# Patient Record
Sex: Male | Born: 1985 | Race: Black or African American | Hispanic: No | Marital: Single | Smoking: Current every day smoker
Health system: Southern US, Community
[De-identification: ages and names within clinical notes are randomized; demographics above are authoritative.]

## PROBLEM LIST (undated history)

## (undated) DIAGNOSIS — K219 Gastro-esophageal reflux disease without esophagitis: Secondary | ICD-10-CM

## (undated) DIAGNOSIS — I639 Cerebral infarction, unspecified: Secondary | ICD-10-CM

## (undated) DIAGNOSIS — G4730 Sleep apnea, unspecified: Secondary | ICD-10-CM

## (undated) DIAGNOSIS — J45909 Unspecified asthma, uncomplicated: Secondary | ICD-10-CM

## (undated) DIAGNOSIS — I10 Essential (primary) hypertension: Secondary | ICD-10-CM

## (undated) HISTORY — DX: Essential (primary) hypertension: I10

---

## 2005-03-01 ENCOUNTER — Emergency Department: Payer: Self-pay | Admitting: Unknown Physician Specialty

## 2005-10-28 ENCOUNTER — Emergency Department: Payer: Self-pay | Admitting: Emergency Medicine

## 2006-05-29 ENCOUNTER — Emergency Department: Payer: Self-pay | Admitting: Emergency Medicine

## 2007-07-06 ENCOUNTER — Emergency Department: Payer: Self-pay | Admitting: Emergency Medicine

## 2007-07-08 ENCOUNTER — Emergency Department: Payer: Self-pay | Admitting: Internal Medicine

## 2009-05-31 ENCOUNTER — Emergency Department: Payer: Self-pay | Admitting: Emergency Medicine

## 2010-03-27 ENCOUNTER — Emergency Department: Payer: Self-pay | Admitting: Emergency Medicine

## 2010-03-28 ENCOUNTER — Emergency Department: Payer: Self-pay | Admitting: Emergency Medicine

## 2010-04-08 ENCOUNTER — Ambulatory Visit: Payer: Self-pay | Admitting: Specialist

## 2011-02-22 ENCOUNTER — Emergency Department: Payer: Self-pay | Admitting: Emergency Medicine

## 2014-08-05 ENCOUNTER — Emergency Department: Payer: Self-pay | Admitting: Emergency Medicine

## 2014-08-29 ENCOUNTER — Ambulatory Visit: Payer: Self-pay | Admitting: Orthopedic Surgery

## 2015-03-22 IMAGING — CT CT OF THE LEFT SHOULDER WITHOUT CONTRAST
4 of 5 series · 15 of 33 positions shown, 17 images · non-contrast
Comparison: 08/05/2014

CLINICAL DATA: Proximal humeral fracture.

EXAM:
CT OF THE LEFT SHOULDER WITHOUT CONTRAST
TECHNIQUE: Multidetector CT imaging was performed according to the standard
protocol. Multiplanar CT image reconstructions were also generated.

[Series 2: shoulder · axial · 0.45mm/px · z∈[-502,-430]mm · 4 of 81 slices shown, 5 images]
[im 17/81  soft-tissue]
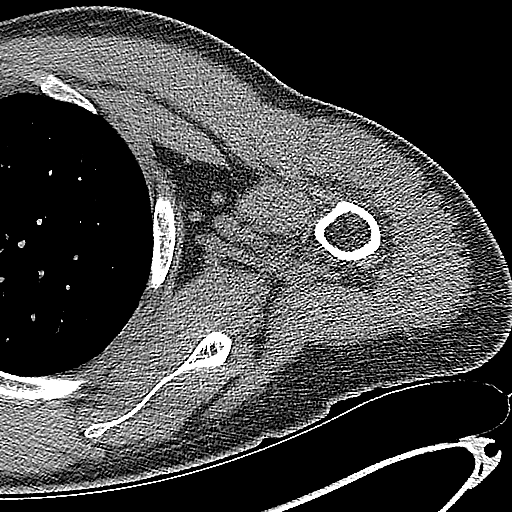
[im 17/81  bone]
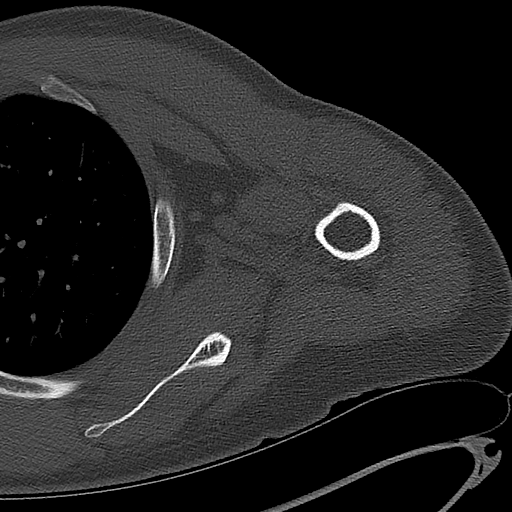
[im 33/81  bone]
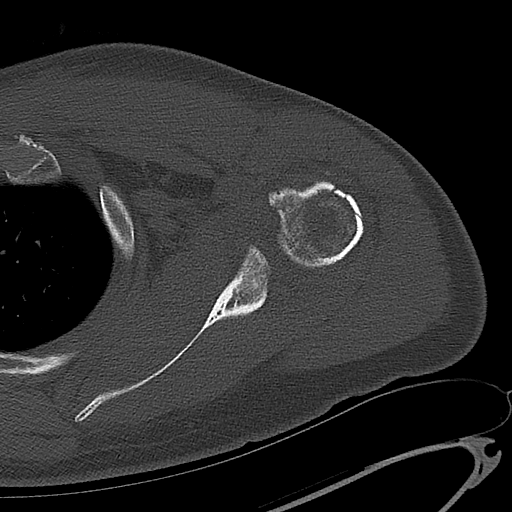
[im 49/81  bone]
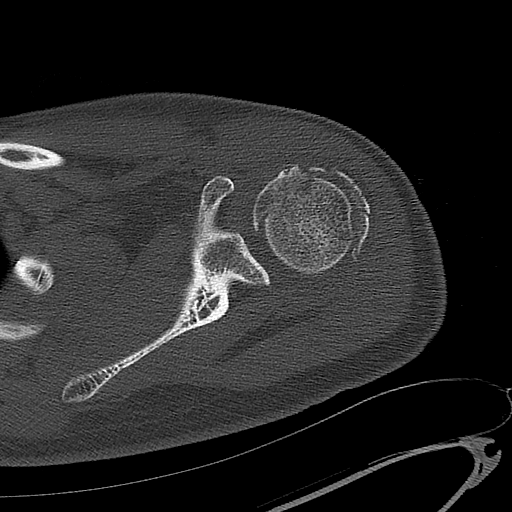
[im 65/81  bone]
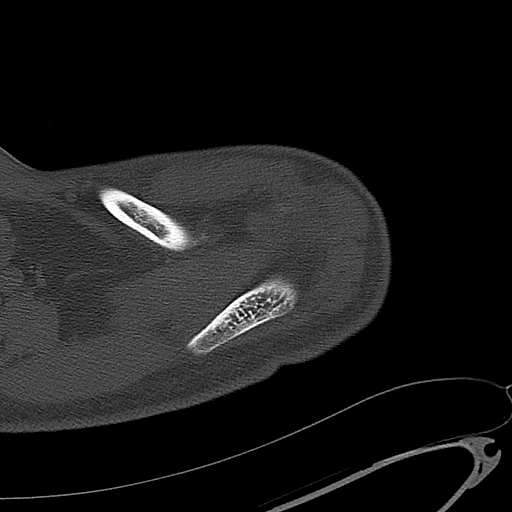

[Series 605: soft tissue axial · axial · 0.45mm/px · z∈[-544,-479]mm · 3 of 69 slices shown]
[im 18/69  soft-tissue]
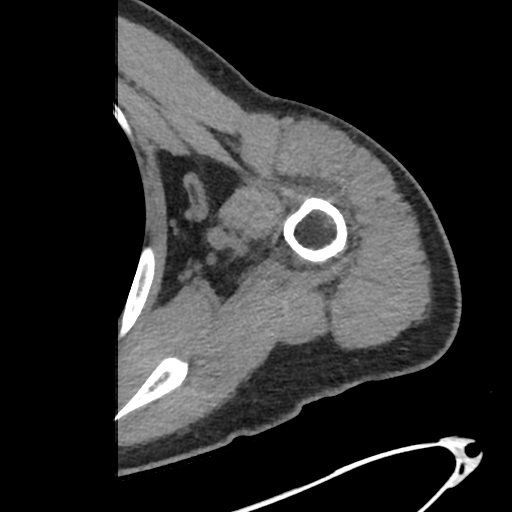
[im 35/69  soft-tissue]
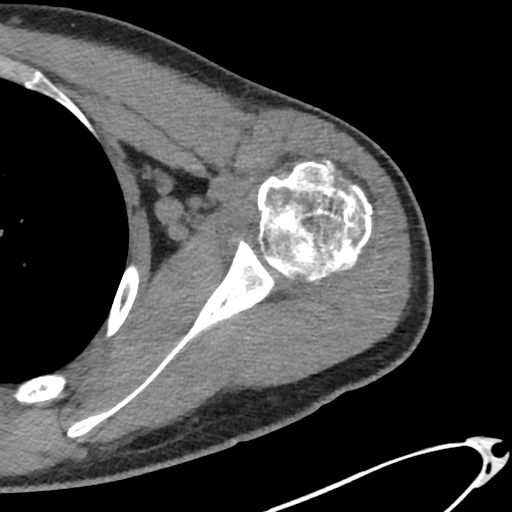
[im 52/69  soft-tissue]
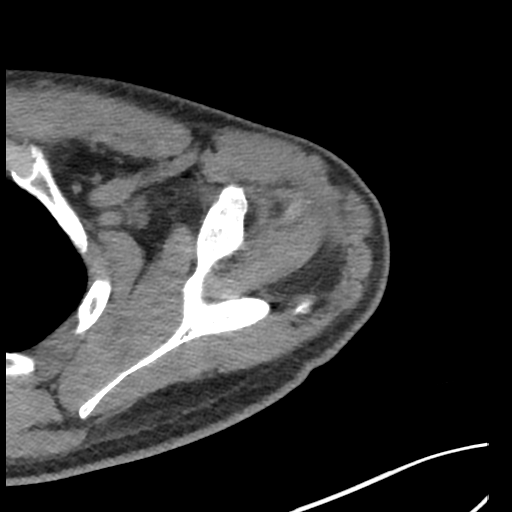

[Series 606: soft tissue coronal · coronal · 0.45mm/px · 3 of 53 slices shown]
[im 11/53  bone]
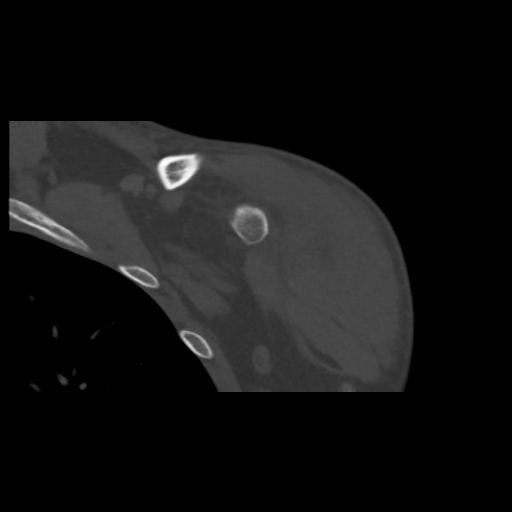
[im 21/53  bone]
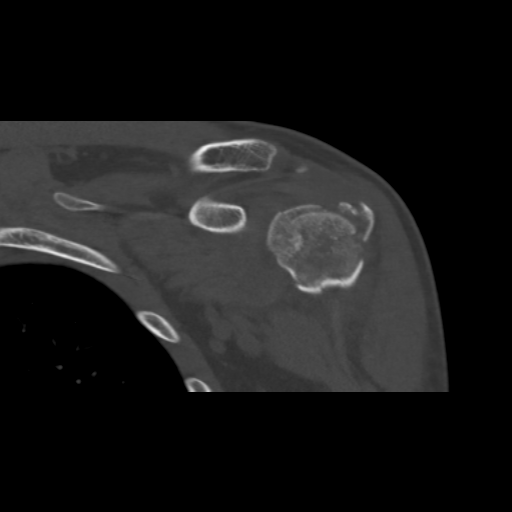
[im 32/53  bone]
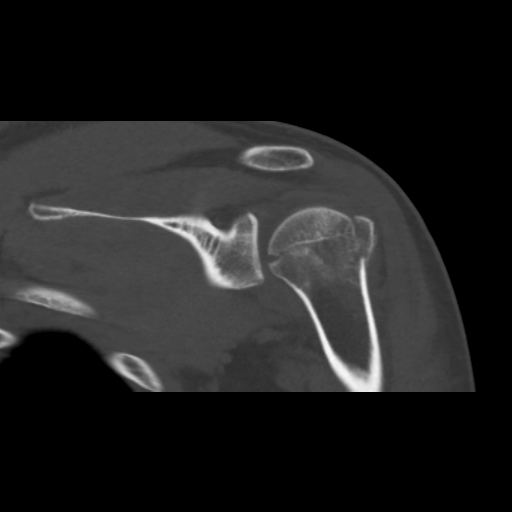

[Series 607: soft tissue sagittal · sagittal · 0.45mm/px · 5 of 64 slices shown, 6 images]
[im 22/64  bone]
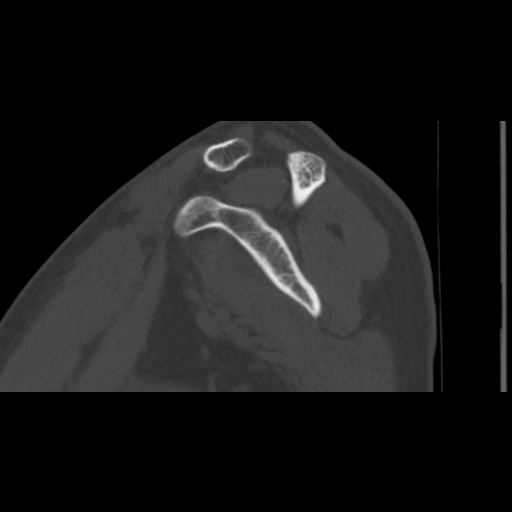
[im 27/64  bone]
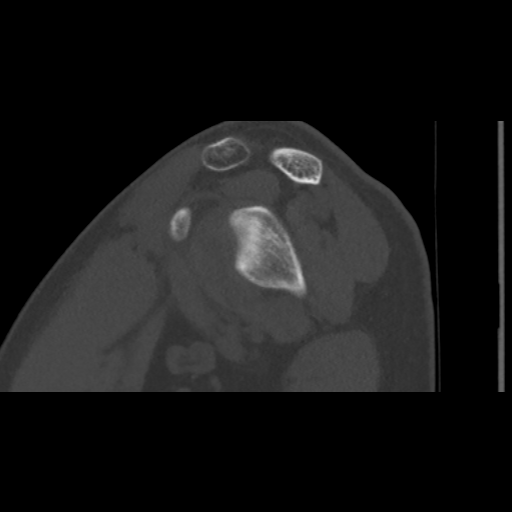
[im 32/64  soft-tissue]
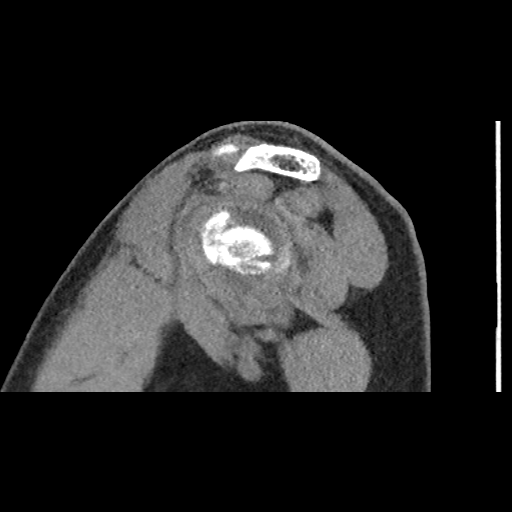
[im 32/64  bone]
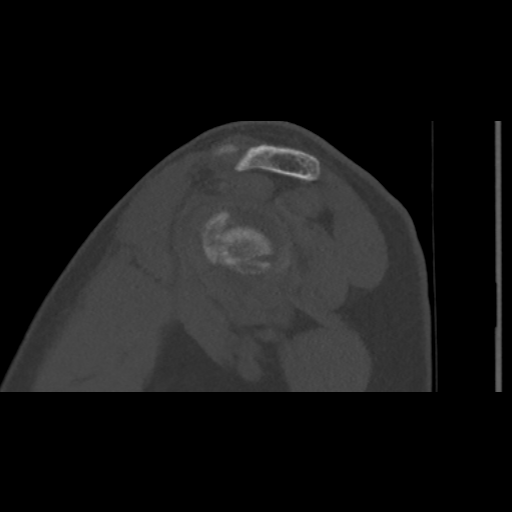
[im 37/64  bone]
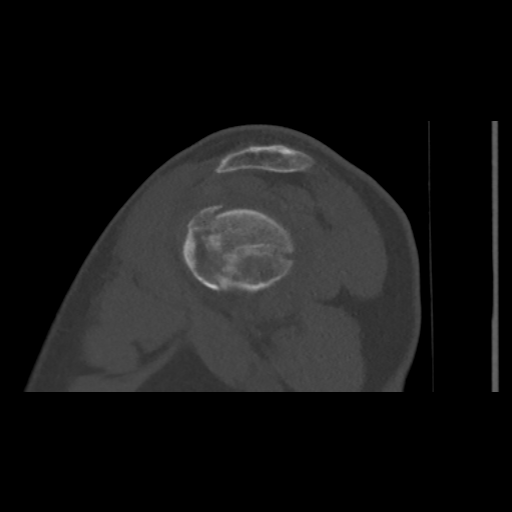
[im 43/64  bone]
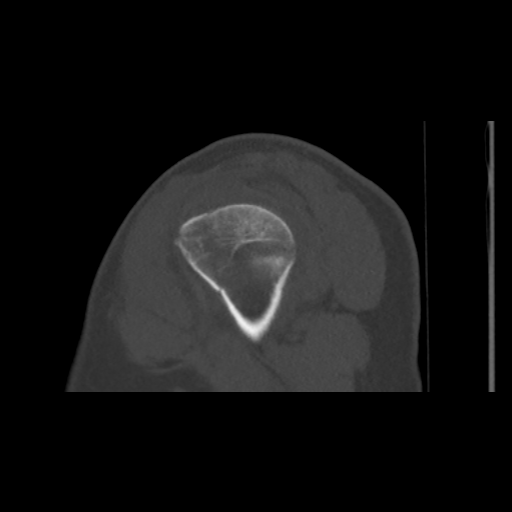

[15 of 33 positions shown; findings below may reference images not displayed]

FINDINGS: Fracture the anatomic neck of the humerus noted, transverse
component along the site of the former growth plate and also with
comminuted fractures of the greater and lesser tuberosities
extending longitudinally. 3 mm loose fragment posteriorly in the
joint, image 55 of series 2.

No scapular are adjacent rib fracture identified. No lateral
clavicular fracture. As expected there is a joint effusion and
surrounding hematoma. No discrete entrapment of the long head of the
biceps within a fracture plane.
IMPRESSION: 1. Comminuted proximal humeral fracture with transverse component
through the anatomic neck, and with comminuted longitudinal
components extending through the greater and lesser tuberosities.

## 2016-11-05 ENCOUNTER — Encounter: Payer: Self-pay | Admitting: Emergency Medicine

## 2016-11-05 ENCOUNTER — Emergency Department
Admission: EM | Admit: 2016-11-05 | Discharge: 2016-11-05 | Disposition: A | Discharge status: Home / Self Care | Payer: Self-pay | Attending: Emergency Medicine | Admitting: Emergency Medicine

## 2016-11-05 DIAGNOSIS — Z202 Contact with and (suspected) exposure to infections with a predominantly sexual mode of transmission: Secondary | ICD-10-CM | POA: Insufficient documentation

## 2016-11-05 DIAGNOSIS — F17210 Nicotine dependence, cigarettes, uncomplicated: Secondary | ICD-10-CM | POA: Insufficient documentation

## 2016-11-05 MED ORDER — METRONIDAZOLE 500 MG PO TABS: 2000.0000 mg | ORAL_TABLET | Freq: Once | ORAL | 0 refills | Status: AC

## 2016-11-05 NOTE — ED Triage Notes (Addendum)
Patient from home via POV with c/o 'exposure to trichomoniasis'.  Patient states his fiancee was diagnosed and placed on antibiotics.  He was exposed and wishes to be treated for such.  Denies symptoms.  Patient states that he and his fiancee are monogamous

## 2016-11-05 NOTE — ED Provider Notes (Signed)
[facility] Emergency Department Provider Note  ____________________________________________  Time seen: Approximately 11:08 AM  I have reviewed the triage vital signs and the nursing notes.   HISTORY  Chief Complaint Exposure to STD    HPI Lance Hart is a 30 y.o. male , NAD, presents to emergency room for evaluation of exposure to trichomoniasis. States his fiance, who is pregnant, was told yesterday that she tested positive for trichomoniasis and negative for any other STI. She explained to him that she was told by medical provider that she has had the infection since 2012. Patient denies any fevers, chills, body aches, night sweats, genital pain, genital sores, urethral discharge, dysuria, hematuria, changes in urinary habits. Has had no abdominal pain, nausea, vomiting, chest pain or shortness breath. Patient notes that he is in a monogamous relationship with his male fianc. Is requesting treatment with metronidazole and declines any further testing at this time.   History reviewed. No pertinent past medical history.  There are no active problems to display for this patient.   History reviewed. No pertinent surgical history.  Prior to Admission medications   Medication Sig Start Date End Date Taking? Authorizing Provider  metroNIDAZOLE (FLAGYL) 500 MG tablet Take 4 tablets (2,000 mg total) by mouth once. 11/05/16 11/05/16  Jami L Hagler, PA-C    Allergies Patient has no known allergies.  History reviewed. No pertinent family history.  Social History Social History  Substance Use Topics  . Smoking status: Current Every Day Smoker    Types: Cigarettes  . Smokeless tobacco: Never Used  . Alcohol use Not on file     Review of Systems  Constitutional: No fever/chills, Night sweats, fatigue Cardiovascular: No chest pain. Respiratory:  No shortness of breath.  Gastrointestinal: No abdominal pain.  No nausea, vomiting.  Genitourinary:  Negative for dysuria, hematuria, urethral discharge, genital pain. No urinary hesitancy, urgency or increased frequency. Musculoskeletal: Negative for General myalgias.  Skin: Negative for skin sores. 10-point ROS otherwise negative.  ____________________________________________   PHYSICAL EXAM:  VITAL SIGNS: ED Triage Vitals  Enc Vitals Group     BP 11/05/16 1036 (!) 147/98     Pulse Rate 11/05/16 1036 60     Resp 11/05/16 1036 15     Temp 11/05/16 1036 98.6 F (37 C)     Temp Source 11/05/16 1036 Oral     SpO2 11/05/16 1036 100 %     Weight 11/05/16 1038 150 lb (68 kg)     Height 11/05/16 1038 5\' 8"  (1.727 m)     Head Circumference --      Peak Flow --      Pain Score --      Pain Loc --      Pain Edu? --      Excl. in GC? --      Constitutional: Alert and oriented. Well appearing and in no acute distress. Eyes: Conjunctivae are normal.  Head: Atraumatic. Neck: Supple with full range of motion Hematological/Lymphatic/Immunilogical: No cervical lymphadenopathy. Cardiovascular: Good peripheral circulation. Respiratory: Normal respiratory effort without tachypnea or retractions.  Neurologic:  Normal speech and language. No gross focal neurologic deficits are appreciated.  Skin:  Skin is warm, dry and intact. No rash noted. Psychiatric: Mood and affect are normal. Speech and behavior are normal. Patient exhibits appropriate insight and judgement.   ____________________________________________   LABS  None ____________________________________________  EKG  None ____________________________________________  RADIOLOGY  None ____________________________________________    PROCEDURES  Procedure(s) performed: None  Procedures   Medications - No data to display   ____________________________________________   INITIAL IMPRESSION / ASSESSMENT AND PLAN / ED COURSE  Pertinent labs & imaging results that were available during my care of the patient  were reviewed by me and considered in my medical decision making (see chart for details).  Clinical Course     Patient's diagnosis is consistent with Exposure to Trichomonas. Patient will be discharged home with prescriptions for metronidazole to take as directed. [facility] if symptoms persist past this treatment course and for further treatment or evaluation of STI as needed. Patient is given ED precautions to return to the ED for any worsening or new symptoms.   ____________________________________________  FINAL CLINICAL IMPRESSION(S) / ED DIAGNOSES  Final diagnoses:  Exposure to STD  Exposure to trichomonas      NEW MEDICATIONS STARTED DURING THIS VISIT:  Discharge Medication List as of 11/05/2016 11:09 AM    START taking these medications   Details  metroNIDAZOLE (FLAGYL) 500 MG tablet Take 4 tablets (2,000 mg total) by mouth once., Starting Sat 11/05/2016, Print             Ernestene KielJami L MiltonHagler, PA-C 11/05/16 1121    Phineas SemenGraydon Goodman, MD 11/05/16 1544

## 2024-02-29 ENCOUNTER — Ambulatory Visit: Payer: Self-pay

## 2024-02-29 NOTE — Telephone Encounter (Signed)
  Chief Complaint: high blood pressure Symptoms: chest, ear and back pain.  Frequency: Intermittent  Pertinent Negatives: Patient denies other symptoms  Disposition: [ X]ED /[ ] Urgent Care (no appt availability in office) / [ ] Appointment(In office/virtual)/ [ ]  [facility]/ [ ] Home Care/ [ ] Refused Recommended Disposition /[ ] [place] [facility]/ [ ]  Follow-up with PCP  Additional Notes:  New patient. Calling with complaint of ear pain, intermittent chest pain, and back pain. No chest discomfort now. All symptoms start together and last about 20 minutes. Emergency room advised, patient hesitant due to insurance coverage, but agreeable after education. Wife will transport him to emergency room.    Copied from CRM [id number]. Topic: Clinical - Red Word Triage >> Feb 29, 2024  4:37 PM Turkey B wrote: [facility] that prompted transfer to Nurse Triage: pt has sob, pain in back, ringing in ears, bp high, last one was 169/118 Reason for Disposition  [1] Systolic BP  >= 160 OR Diastolic >= 100 AND [2] cardiac (e.g., breathing difficulty, chest pain) or neurologic symptoms (e.g., new-onset blurred or double vision, unsteady gait)  Answer Assessment - Initial Assessment Questions 1. BLOOD PRESSURE: "What is the blood pressure?" "Did you take at least two measurements 5 minutes apart?"     169/118 2. ONSET: "When did you take your blood pressure?"     Today 3. HOW: "How did you take your blood pressure?" (e.g., automatic home BP monitor, visiting nurse)     Took on machine at [place].  4. HISTORY: "Do you have a history of high blood pressure?"     No 5. MEDICINES: "Are you taking any medicines for blood pressure?" "Have you missed any doses recently?"     No 6. OTHER SYMPTOMS: "Do you have any symptoms?" (e.g., blurred vision, chest pain, difficulty breathing, headache, weakness)     Intermittent chest pain, shortness of breath, ear pain and back.  Protocols used: Blood Pressure -  High-A-AH

## 2024-03-01 ENCOUNTER — Emergency Department
Admission: EM | Admit: 2024-03-01 | Discharge: 2024-03-01 | Disposition: A | Discharge status: Home / Self Care | Attending: Emergency Medicine | Admitting: Emergency Medicine

## 2024-03-01 ENCOUNTER — Other Ambulatory Visit: Payer: Self-pay

## 2024-03-01 ENCOUNTER — Emergency Department

## 2024-03-01 DIAGNOSIS — I10 Essential (primary) hypertension: Secondary | ICD-10-CM | POA: Diagnosis not present

## 2024-03-01 DIAGNOSIS — R109 Unspecified abdominal pain: Secondary | ICD-10-CM | POA: Diagnosis present

## 2024-03-01 DIAGNOSIS — R1013 Epigastric pain: Secondary | ICD-10-CM | POA: Insufficient documentation

## 2024-03-01 LAB — CBC
HCT: 38.5 % — ABNORMAL LOW (ref 39.0–52.0)
Hemoglobin: 12.7 g/dL — ABNORMAL LOW (ref 13.0–17.0)
MCH: 26.9 pg (ref 26.0–34.0)
MCHC: 33 g/dL (ref 30.0–36.0)
MCV: 81.6 fL (ref 80.0–100.0)
Platelets: 334 10*3/uL (ref 150–400)
RBC: 4.72 MIL/uL (ref 4.22–5.81)
RDW: 16.6 % — ABNORMAL HIGH (ref 11.5–15.5)
WBC: 10 10*3/uL (ref 4.0–10.5)
nRBC: 0 % (ref 0.0–0.2)

## 2024-03-01 LAB — LIPASE, BLOOD: Lipase: 61 U/L — ABNORMAL HIGH (ref 11–51)

## 2024-03-01 LAB — BASIC METABOLIC PANEL
Anion gap: 10 (ref 5–15)
BUN: 13 mg/dL (ref 6–20)
CO2: 23 mmol/L (ref 22–32)
Calcium: 8.9 mg/dL (ref 8.9–10.3)
Chloride: 105 mmol/L (ref 98–111)
Creatinine, Ser: 0.95 mg/dL (ref 0.61–1.24)
GFR, Estimated: >60 mL/min (ref >60)
Glucose, Bld: 102 mg/dL — ABNORMAL HIGH (ref 70–99)
Potassium: 4 mmol/L (ref 3.5–5.1)
Sodium: 138 mmol/L (ref 135–145)

## 2024-03-01 LAB — HEPATIC FUNCTION PANEL
ALT: 32 U/L (ref 0–44)
AST: 24 U/L (ref 15–41)
Albumin: 4 g/dL (ref 3.5–5.0)
Alkaline Phosphatase: 70 U/L (ref 38–126)
Bilirubin, Direct: <0.1 mg/dL (ref 0.0–0.2)
Total Bilirubin: 0.2 mg/dL (ref 0.0–1.2)
Total Protein: 7.6 g/dL (ref 6.5–8.1)

## 2024-03-01 LAB — TROPONIN I (HIGH SENSITIVITY): Troponin I (High Sensitivity): 13 ng/L (ref <18)

## 2024-03-01 MED ORDER — AMLODIPINE BESYLATE 5 MG PO TABS: 5.0000 mg | ORAL_TABLET | Freq: Every day | ORAL | 2 refills | Status: DC

## 2024-03-01 MED ORDER — OMEPRAZOLE MAGNESIUM 20 MG PO TBEC: 20.0000 mg | DELAYED_RELEASE_TABLET | Freq: Every day | ORAL | 0 refills | Status: DC

## 2024-03-01 MED ORDER — SUCRALFATE 1 G PO TABS: 1.0000 g | ORAL_TABLET | Freq: Three times a day (TID) | ORAL | 0 refills | Status: DC | PRN

## 2024-03-01 NOTE — ED Triage Notes (Signed)
 Pt to ED for intermittent chest pain x3 days. Reports high blood pressure today. Also reports feels like has had a panic attack recently and difficulty sleeping at night because "I stop breathing" NAD noted, RR even and unlabored.

## 2024-03-01 NOTE — Discharge Instructions (Signed)
 You were seen in the ER today for abdominal pain and chest pain.  Your testing was fortunately reassuring.  Your blood pressure was high.  I sent a prescription for 2 medications to help with stomach acid to your pharmacy.  Please trial these over the next few weeks to see if they improve your symptoms.  I have also sent a prescription for a blood pressure medication.  Keep your scheduled follow-up with your primary care doctor.  Keep a log of your blood pressures until you see your PCP.  Return to the ER for new or worsening symptoms.

## 2024-03-01 NOTE — ED Provider Notes (Signed)
 [facility] Provider Note    Event Date/Time   First MD Initiated Contact with Patient 03/01/24 [phone]     (approximate)   History   Chest Pain and Hypertension   HPI  Kacee Koren is a 38 year old male presenting to the emergency department for evaluation of chest and abdominal pain.  Reports that over the past month, he has had episodes of pain beginning in his right upper abdomen extending into his chest and back.  Episodes last for about 15 to 20 minutes, sharp.  Not specifically pleuritic, exertional, associated with food.  Over the past few days, he has had near daily episodes.  He recently bought a blood pressure cuff and has noticed that he has had elevated blood pressures at home with systolics up to the 170s.  Establishing care with primary care with appointment next month, but it was recommended he present to the ER for further evaluation in the setting of his elevated blood pressure.  Reports some thick mucus and pressure in his ears more notably on the left side, but denies cough, congestion.  No fevers.  Has had multiple recent sick contacts.     Physical Exam   Triage Vital Signs: ED Triage Vitals  Encounter Vitals Group     BP 03/01/24 0749 (!) 166/118     Systolic BP Percentile --      Diastolic BP Percentile --      Pulse Rate 03/01/24 0749 85     Resp 03/01/24 0749 18     Temp 03/01/24 0749 98.1 F (36.7 C)     Temp src --      SpO2 03/01/24 0749 100 %     Weight 03/01/24 0746 205 lb (93 kg)     Height 03/01/24 0746 5\' 8"  (1.727 m)     Head Circumference --      Peak Flow --      Pain Score 03/01/24 0746 7     Pain Loc --      Pain Education --      Exclude from Growth Chart --     Most recent vital signs: Vitals:   03/01/24 1002 03/01/24 1003  BP:    Pulse: 73 77  Resp: 14 18  Temp:    SpO2: 100% 100%     General: Awake, interactive  HEENT: Right TM clear, small effusion over the left ear without redness or  bulging CV:  Regular rate, good peripheral perfusion.  Resp:  Unlabored respirations, lungs clear to auscultation Abd:  Nondistended, soft, no significant tenderness to palpation, negative Murphy sign Neuro:  Symmetric facial movement, fluid speech   ED Results / Procedures / Treatments   Labs (all labs ordered are listed, but only abnormal results are displayed) Labs Reviewed  BASIC METABOLIC PANEL - Abnormal; Notable for the following components:      Result Value   Glucose, Bld 102 (*)    All other components within normal limits  CBC - Abnormal; Notable for the following components:   Hemoglobin 12.7 (*)    HCT 38.5 (*)    RDW 16.6 (*)    All other components within normal limits  LIPASE, BLOOD - Abnormal; Notable for the following components:   Lipase 61 (*)    All other components within normal limits  HEPATIC FUNCTION PANEL  TROPONIN I (HIGH SENSITIVITY)     EKG EKG independently reviewed interpreted by myself (ER attending) demonstrates:  EKG demonstrates normal sinus rhythm at  a rate of 82, PR 144, QRS 90, QTc 464, no acute ST changes  RADIOLOGY Imaging independently reviewed and interpreted by myself demonstrates:  CXR without focal consolidation   PROCEDURES:  Critical Care performed: No  Procedures   MEDICATIONS ORDERED IN ED: Medications - No data to display   IMPRESSION / MDM / ASSESSMENT AND PLAN / ED COURSE  I reviewed the triage vital signs and the nursing notes.  Differential diagnosis includes, but is not limited to, ACS, pneumonia, pneumothorax, viral illness, gastritis, biliary pathology, overall lower suspicion acute intra-abdominal process given reassuring abdominal exam, clinical exam overall not suggestive of hypertensive emergency  Patient's presentation is most consistent with acute presentation with potential threat to life or bodily function.  38 year old male presenting with chest and abdominal pain.  Hypertensive on presentation,  otherwise stable vitals.  Will obtain labs, EKG, x-Judia Arnott to further evaluate.  10:06 AM Lab work with mild anemia without recent prior for comparison, minimally elevated lipase not consistent with pancreatitis.  Labs otherwise reassuring including negative troponin with greater than 3 hours of pain.  X-Rihanna Marseille without acute findings.  EKG without acute ischemic changes.  Patient reassessed and updated on results of workup.  Suspect possible GI related chest pain based on history.  Will trial omeprazole and sucralfate.  Has had multiple elevated blood pressure readings here as well as at home.  Will plan to start on antihypertensive.  Does have scheduled follow-up with primary care doctor.  Do think he is stable for discharge home.  Patient comfortable this plan.  Strict return precautions provided.     FINAL CLINICAL IMPRESSION(S) / ED DIAGNOSES   Final diagnoses:  Epigastric pain  Uncontrolled hypertension     Rx / DC Orders   ED Discharge Orders          Ordered    amLODipine (NORVASC) 5 MG tablet  Daily        03/01/24 1005    omeprazole (PRILOSEC OTC) 20 MG tablet  Daily        03/01/24 1005    sucralfate (CARAFATE) 1 g tablet  Every 8 hours PRN        03/01/24 1005             Note:  This document was prepared using Dragon voice recognition software and may include unintentional dictation errors.   Trinna Post, MD 03/01/24 1006

## 2024-04-24 ENCOUNTER — Encounter: Payer: Self-pay | Admitting: Pediatrics

## 2024-04-24 ENCOUNTER — Ambulatory Visit: Payer: Self-pay | Admitting: Pediatrics

## 2024-04-24 VITALS — BP 125/86 | HR 75 | Temp 97.3°F | Ht 67.0 in | Wt 217.8 lb

## 2024-04-24 DIAGNOSIS — F17210 Nicotine dependence, cigarettes, uncomplicated: Secondary | ICD-10-CM | POA: Diagnosis not present

## 2024-04-24 DIAGNOSIS — I10 Essential (primary) hypertension: Secondary | ICD-10-CM

## 2024-04-24 DIAGNOSIS — Z7689 Persons encountering health services in other specified circumstances: Secondary | ICD-10-CM

## 2024-04-24 DIAGNOSIS — F17200 Nicotine dependence, unspecified, uncomplicated: Secondary | ICD-10-CM | POA: Diagnosis not present

## 2024-04-24 DIAGNOSIS — K219 Gastro-esophageal reflux disease without esophagitis: Secondary | ICD-10-CM | POA: Diagnosis not present

## 2024-04-24 DIAGNOSIS — Z1330 Encounter for screening examination for mental health and behavioral disorders, unspecified: Secondary | ICD-10-CM | POA: Diagnosis not present

## 2024-04-24 DIAGNOSIS — Z13220 Encounter for screening for lipoid disorders: Secondary | ICD-10-CM

## 2024-04-24 DIAGNOSIS — Z131 Encounter for screening for diabetes mellitus: Secondary | ICD-10-CM

## 2024-04-24 MED ORDER — OMEPRAZOLE 40 MG PO CPDR
40.0000 mg | DELAYED_RELEASE_CAPSULE | Freq: Two times a day (BID) | ORAL | 3 refills | Status: DC
Start: 2024-04-24 — End: 2024-08-23

## 2024-04-24 MED ORDER — NICOTINE 7 MG/24HR TD PT24: 7.0000 mg | MEDICATED_PATCH | Freq: Every day | TRANSDERMAL | 0 refills | Status: AC

## 2024-04-24 MED ORDER — SUCRALFATE 1 G PO TABS: 1.0000 g | ORAL_TABLET | Freq: Three times a day (TID) | ORAL | 0 refills | Status: DC | PRN

## 2024-04-24 MED ORDER — NICOTINE 21 MG/24HR TD PT24
21.0000 mg | MEDICATED_PATCH | Freq: Every day | TRANSDERMAL | 0 refills | Status: AC
Start: 2024-04-24 — End: 2024-05-15

## 2024-04-24 MED ORDER — NICOTINE 14 MG/24HR TD PT24
14.0000 mg | MEDICATED_PATCH | Freq: Every day | TRANSDERMAL | 0 refills | Status: AC
Start: 2024-04-24 — End: 2024-05-08

## 2024-04-24 MED ORDER — NICOTINE 10 MG IN INHA: 1.0000 | RESPIRATORY_TRACT | 2 refills | Status: DC | PRN

## 2024-04-24 MED ORDER — AMLODIPINE BESYLATE 5 MG PO TABS: 5.0000 mg | ORAL_TABLET | Freq: Every day | ORAL | 3 refills | Status: DC

## 2024-04-24 NOTE — Progress Notes (Signed)
 Establish Care Note  BP 125/86   Pulse 75   Temp (!) 97.3 F (36.3 C)   Ht 5\' 7"  (1.702 m)   Wt 217 lb 12.8 oz (98.8 kg)   SpO2 98%   BMI 34.11 kg/m    Subjective:    Patient ID: Lance Hart, male    DOB: September 18, 1986, 38 y.o.   MRN: [id number]  HPI: Lance Hart is a 38 y.o. male  Chief Complaint  Patient presents with   Establish Care    Establishing care, the following was discussed today:  Discussed the use of AI scribe software for clinical note transcription with the patient, who gave verbal consent to proceed.  History of Present Illness   Lance Hart is a 38 year old male who presents for follow-up after an ER visit for hypertension.  He experienced an episode of extremely high blood pressure, reaching 198/unknown, which prompted an ER visit. He was diagnosed with hypertension and started on amlodipine  5 mg daily, which effectively controls his blood pressure. Prior to this, he was unaware of his hypertension until he experienced symptoms such as tinnitus, back pain, and insomnia, all of which have improved with medication. He has not had a primary care doctor for over 20 years until now.  He has a history of gastroesophageal reflux disease, exacerbated by certain foods like red sauce and alcohol. Symptoms include vomiting, particularly at night after alcohol consumption. He takes omeprazole  20 mg once daily and Carafate  as needed, which he finds effective. He avoids red sauce as it causes vomiting.  He smokes about a pack of cigarettes a day and has attempted to quit in the past. He also consumes alcohol, noting that excessive intake leads to vomiting, prompting him to reduce his consumption. 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      #HM Will review HM records and updated as needed.  Relevant past medical, surgical, family and social history reviewed and updated as indicated. Interim medical history since our last visit  reviewed. Allergies and medications reviewed and updated.  ROS per HPI unless specifically indicated above     Objective:    BP 125/86   Pulse 75   Temp (!) 97.3 F (36.3 C)   Ht 5\' 7"  (1.702 m)   Wt 217 lb 12.8 oz (98.8 kg)   SpO2 98%   BMI 34.11 kg/m   Wt Readings from Last 3 Encounters:  04/24/24 217 lb 12.8 oz (98.8 kg)  03/01/24 205 lb (93 kg)  11/05/16 150 lb (68 kg)     Physical Exam Constitutional:      Appearance: Normal appearance.  HENT:     Head: Normocephalic and atraumatic.  Eyes:     Pupils: Pupils are equal, round, and reactive to light.  Cardiovascular:     Rate and Rhythm: Normal rate and regular rhythm.     Pulses: Normal pulses.     Heart sounds: Normal heart sounds.  Pulmonary:     Effort: Pulmonary effort is normal.     Breath sounds: Normal breath sounds.  Musculoskeletal:        General: Normal range of motion.     Cervical back: Normal range of motion.  Skin:    General: Skin is warm and dry.     Capillary Refill: Capillary refill takes less than 2 seconds.  Neurological:     General: No focal deficit present.     Mental Status: He  is alert. Mental status is at baseline.  Psychiatric:        Mood and Affect: Mood normal.        Behavior: Behavior normal.         04/24/2024    1:16 PM  Depression screen PHQ 2/9  Decreased Interest 0  Down, Depressed, Hopeless 0  PHQ - 2 Score 0  Altered sleeping 3  Tired, decreased energy 0  Change in appetite 0  Feeling bad or failure about yourself  0  Trouble concentrating 0  Moving slowly or fidgety/restless 0  Suicidal thoughts 0  PHQ-9 Score 3  Difficult doing work/chores Not difficult at all        04/24/2024    1:16 PM  GAD 7 : Generalized Anxiety Score  Nervous, Anxious, on Edge 0  Control/stop worrying 0  Worry too much - different things 2  Trouble relaxing 2  Restless 0  Easily annoyed or irritable 0  Afraid - awful might happen 2  Total GAD 7 Score 6  Anxiety  Difficulty Not difficult at all       Assessment & Plan:  Assessment & Plan   Gastroesophageal reflux disease, unspecified whether esophagitis present Assessment & Plan: GERD symptoms include dysphagia, nocturnal emesis, and epigastric pain. Omeprazole  effective; Carafate  not preferred due to nephrotoxicity. Alcohol and smoking exacerbate symptoms. - Increase omeprazole  to 40 mg twice daily before meals. - Prescribe Carafate  for sparing use. - Advise alcohol reduction to under 14 drinks per week. - Advise smoking cessation. - Follow up in 12 weeks to assess symptom control.  Orders: -     CBC with Differential/Platelet -     Iron, TIBC and Ferritin Panel -     Lipase -     Omeprazole ; Take 1 capsule (40 mg total) by mouth 2 (two) times daily before a meal.  Dispense: 60 capsule; Refill: 3 -     Sucralfate ; Take 1 tablet (1 g total) by mouth every 8 (eight) hours as needed for up to 10 days.  Dispense: 30 tablet; Refill: 0  Primary hypertension Assessment & Plan: Hypertension well-controlled on amlodipine  5 mg daily. No symptoms reported. Current management effective. - Continue amlodipine  5 mg daily. - Monitor blood pressure regularly. - Repeat labs to check lipase levels.  Orders: -     Comprehensive metabolic panel with GFR -     amLODIPine  Besylate; Take 1 tablet (5 mg total) by mouth daily.  Dispense: 90 tablet; Refill: 3  Cigarette nicotine  dependence without complication Nicotine  dependence due to vaping non-tobacco product Assessment & Plan: Smoking contributes to GERD and hypertension. Interested in cessation aids. Discussed nicotine  patches and inhalers. Total time spent:12 minutes - Prescribe nicotine  patches: 21 mg for 21 days, 14 mg for 14 days, 7 mg for 7 days. - Prescribe nicotine  inhaler for cravings. - Encourage smoking cessation.  Orders: -     Nicotine ; Place 1 patch (21 mg total) onto the skin daily for 21 days.  Dispense: 21 patch; Refill: 0 -      Nicotine ; Place 1 patch (14 mg total) onto the skin daily for 14 days.  Dispense: 14 patch; Refill: 0 -     Nicotine ; Place 1 patch (7 mg total) onto the skin daily for 14 days.  Dispense: 14 patch; Refill: 0 -     Nicotine ; Inhale 1 Cartridge (1 continuous puffing total) into the lungs as needed for smoking cessation. Use between 6-16 cartridges per day.  Dispense: 42  each; Refill: 2  Encounter to establish care Reviewed available patient record including history, medications, problem list. HM updated as able. Will review and/or request outside records (if applicable) and will fill remaining HM gaps as needed at follow up visit.  Encounter for behavioral health screening As part of their intake evaluation, the patient was screened for depression, anxiety.  PHQ9 SCORE 3, GAD7 SCORE 6. Screening results negative for tested conditions. See plan under problem/diagnosis above.   Diabetes mellitus screening -     Hemoglobin A1c  Lipid screening -     Lipid panel   Follow up plan: Return in about 3 months (around 07/24/2024) for Physical.  Hadassah Letters, MD

## 2024-04-24 NOTE — Patient Instructions (Addendum)
 For smoking cessation: - plan to start patch and inhalers - Patch: 21mg  for 21 days, then 14mg  for 14 days, 7mg  for 7 days  Omeprazole  40mg  take 15-49min before twice daily Carafate  as needed  I sent blood pressure med refills  Good to meet you! Welcome to [facility]!  As your primary care doctor, I look forward to working with you to help you reach your health goals.  Please be aware of a couple of logistical items: - If you message me on mychart, it may take me 1-2 business days to get back to you. This is for non-urgent messaging.  - If you require urgent clinical attention, please call the clinic or present to urgent care/emergency room - If you have labs, I typically will send a message about them in 1-2 business days. - I am not here on Mondays, otherwise will be available from Tuesday-Friday during 8a-5pm.

## 2024-04-25 LAB — LIPASE: Lipase: 91 U/L — ABNORMAL HIGH (ref 13–78)

## 2024-04-25 LAB — COMPREHENSIVE METABOLIC PANEL WITH GFR
ALT: 28 IU/L (ref 0–44)
AST: 21 IU/L (ref 0–40)
Albumin: 4.6 g/dL (ref 4.1–5.1)
Alkaline Phosphatase: 107 IU/L (ref 44–121)
BUN/Creatinine Ratio: 11 (ref 9–20)
BUN: 11 mg/dL (ref 6–20)
Bilirubin Total: 0.3 mg/dL (ref 0.0–1.2)
CO2: 23 mmol/L (ref 20–29)
Calcium: 9.5 mg/dL (ref 8.7–10.2)
Chloride: 102 mmol/L (ref 96–106)
Creatinine, Ser: 1.03 mg/dL (ref 0.76–1.27)
Globulin, Total: 2.6 g/dL (ref 1.5–4.5)
Glucose: 100 mg/dL — ABNORMAL HIGH (ref 70–99)
Potassium: 4.4 mmol/L (ref 3.5–5.2)
Sodium: 138 mmol/L (ref 134–144)
Total Protein: 7.2 g/dL (ref 6.0–8.5)
eGFR: 95 mL/min/{1.73_m2} (ref >59)

## 2024-04-25 LAB — IRON,TIBC AND FERRITIN PANEL
Ferritin: 20 ng/mL — ABNORMAL LOW (ref 30–400)
Iron Saturation: 9 % — CL (ref 15–55)
Iron: 42 ug/dL (ref 38–169)
Total Iron Binding Capacity: 462 ug/dL — ABNORMAL HIGH (ref 250–450)
UIBC: 420 ug/dL — ABNORMAL HIGH (ref 111–343)

## 2024-04-25 LAB — CBC WITH DIFFERENTIAL/PLATELET
Basophils Absolute: 0.1 10*3/uL (ref 0.0–0.2)
Basos: 1 %
EOS (ABSOLUTE): 0.4 10*3/uL (ref 0.0–0.4)
Eos: 5 %
Hematocrit: 39.1 % (ref 37.5–51.0)
Hemoglobin: 12.8 g/dL — ABNORMAL LOW (ref 13.0–17.7)
Immature Grans (Abs): 0 10*3/uL (ref 0.0–0.1)
Immature Granulocytes: 0 %
Lymphocytes Absolute: 3 10*3/uL (ref 0.7–3.1)
Lymphs: 33 %
MCH: 26.7 pg (ref 26.6–33.0)
MCHC: 32.7 g/dL (ref 31.5–35.7)
MCV: 82 fL (ref 79–97)
Monocytes Absolute: 0.6 10*3/uL (ref 0.1–0.9)
Monocytes: 7 %
Neutrophils Absolute: 4.9 10*3/uL (ref 1.4–7.0)
Neutrophils: 54 %
Platelets: 314 10*3/uL (ref 150–450)
RBC: 4.8 x10E6/uL (ref 4.14–5.80)
RDW: 15.4 % (ref 11.6–15.4)
WBC: 9 10*3/uL (ref 3.4–10.8)

## 2024-04-25 LAB — LIPID PANEL
Chol/HDL Ratio: 4.3 ratio (ref 0.0–5.0)
Cholesterol, Total: 170 mg/dL (ref 100–199)
HDL: 40 mg/dL (ref >39)
LDL Chol Calc (NIH): 91 mg/dL (ref 0–99)
Triglycerides: 234 mg/dL — ABNORMAL HIGH (ref 0–149)
VLDL Cholesterol Cal: 39 mg/dL (ref 5–40)

## 2024-04-25 LAB — HEMOGLOBIN A1C
Est. average glucose Bld gHb Est-mCnc: 126 mg/dL
Hgb A1c MFr Bld: 6 % — ABNORMAL HIGH (ref 4.8–5.6)

## 2024-04-26 ENCOUNTER — Encounter: Payer: Self-pay | Admitting: Pediatrics

## 2024-04-26 DIAGNOSIS — R7303 Prediabetes: Secondary | ICD-10-CM | POA: Insufficient documentation

## 2024-04-26 DIAGNOSIS — E611 Iron deficiency: Secondary | ICD-10-CM | POA: Insufficient documentation

## 2024-04-29 ENCOUNTER — Encounter: Payer: Self-pay | Admitting: Pediatrics

## 2024-04-29 NOTE — Assessment & Plan Note (Signed)
 GERD symptoms include dysphagia, nocturnal emesis, and epigastric pain. Omeprazole  effective; Carafate  not preferred due to nephrotoxicity. Alcohol and smoking exacerbate symptoms. - Increase omeprazole  to 40 mg twice daily before meals. - Prescribe Carafate  for sparing use. - Advise alcohol reduction to under 14 drinks per week. - Advise smoking cessation. - Follow up in 12 weeks to assess symptom control.

## 2024-04-29 NOTE — Assessment & Plan Note (Signed)
 Hypertension well-controlled on amlodipine  5 mg daily. No symptoms reported. Current management effective. - Continue amlodipine  5 mg daily. - Monitor blood pressure regularly. - Repeat labs to check lipase levels.

## 2024-04-29 NOTE — Assessment & Plan Note (Signed)
 Smoking contributes to GERD and hypertension. Interested in cessation aids. Discussed nicotine  patches and inhalers. Total time spent:12 minutes - Prescribe nicotine  patches: 21 mg for 21 days, 14 mg for 14 days, 7 mg for 7 days. - Prescribe nicotine  inhaler for cravings. - Encourage smoking cessation.

## 2024-08-02 ENCOUNTER — Encounter: Payer: Self-pay | Admitting: Pediatrics

## 2024-08-02 ENCOUNTER — Ambulatory Visit (INDEPENDENT_AMBULATORY_CARE_PROVIDER_SITE_OTHER): Admitting: Pediatrics

## 2024-08-02 VITALS — BP 147/91 | HR 73 | Temp 98.7°F | Ht 67.0 in | Wt 223.6 lb

## 2024-08-02 DIAGNOSIS — F17210 Nicotine dependence, cigarettes, uncomplicated: Secondary | ICD-10-CM

## 2024-08-02 DIAGNOSIS — K219 Gastro-esophageal reflux disease without esophagitis: Secondary | ICD-10-CM | POA: Diagnosis not present

## 2024-08-02 DIAGNOSIS — I10 Essential (primary) hypertension: Secondary | ICD-10-CM | POA: Diagnosis not present

## 2024-08-02 DIAGNOSIS — D509 Iron deficiency anemia, unspecified: Secondary | ICD-10-CM | POA: Diagnosis not present

## 2024-08-02 DIAGNOSIS — R7303 Prediabetes: Secondary | ICD-10-CM | POA: Diagnosis not present

## 2024-08-02 MED ORDER — SUCRALFATE 1 G PO TABS: 1.0000 g | ORAL_TABLET | Freq: Three times a day (TID) | ORAL | 0 refills | Status: AC | PRN

## 2024-08-02 NOTE — Progress Notes (Signed)
 Office Visit  BP (!) 147/91   Pulse 73   Temp 98.7 F (37.1 C) (Oral)   Ht 5' 7 (1.702 m)   Wt 223 lb 9.6 oz (101.4 kg)   SpO2 98%   BMI 35.02 kg/m    Subjective:    Patient ID: Lance Hart, male    DOB: 06/23/1986, 38 y.o.   MRN: [id number]  HPI: Lance Hart is a 38 y.o. 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 Interim medical history since our last visit reviewed. Allergies and medications reviewed and updated.  ROS per HPI unless specifically indicated above     Objective:    BP (!) 147/91   Pulse 73   Temp 98.7 F (37.1 C) (Oral)   Ht 5' 7 (1.702 m)   Wt 223 lb 9.6 oz (101.4 kg)   SpO2 98%   BMI 35.02 kg/m   Wt Readings from Last 3 Encounters:  08/02/24 223 lb 9.6 oz (101.4 kg)  04/24/24 217 lb 12.8 oz (98.8 kg)  03/01/24 205 lb (93 kg)     Physical Exam Constitutional:      Appearance: Normal appearance.  Pulmonary:     Effort: Pulmonary effort is normal.  Musculoskeletal:        General: Normal range of motion.  Skin:    Comments: Normal skin color  Neurological:     General: No focal deficit present.     Mental Status: He is alert. Mental status is at baseline.  Psychiatric:        Mood and Affect: Mood normal.        Behavior: Behavior  normal.        Thought Content: Thought content normal.         08/02/2024    8:09 AM 04/24/2024    1:16 PM  Depression screen PHQ 2/9  Decreased Interest 0 0  Down, Depressed, Hopeless 0 0  PHQ - 2 Score 0 0  Altered sleeping 0 3  Tired, decreased energy 0 0  Change in appetite 0 0  Feeling bad or failure about yourself  0 0  Trouble concentrating 0 0  Moving slowly or fidgety/restless 0 0  Suicidal thoughts 0 0  PHQ-9 Score 0 3  Difficult doing work/chores Not difficult at all Not difficult at all       08/02/2024    8:10 AM 04/24/2024    1:16 PM  GAD 7 : Generalized Anxiety Score  Nervous, Anxious, on Edge 0 0  Control/stop worrying 0 0  Worry too much - different things 0 2  Trouble relaxing 0 2  Restless 0 0  Easily annoyed or irritable 0 0  Afraid - awful might happen 0 2  Total GAD 7 Score 0 6  Anxiety Difficulty Not difficult at all  Not difficult at all       Assessment & Plan:  Assessment & Plan   Primary hypertension Assessment & Plan: Previously normal in office. Plan to check 1-2 weeks then return to clinic and reassess. Suspect stress related change but will make adjustments based on home readings at follow up.   Orders: -     Comprehensive metabolic panel with GFR  Iron deficiency anemia, unspecified iron deficiency anemia type Assessment & Plan: Seen on recent blood work. He did take iron supp for a few weeks then stopped. Plan to repeat today.   Orders: -     CBC with Differential/Platelet -     Iron, TIBC and Ferritin Panel  Gastroesophageal reflux disease, unspecified whether esophagitis present Assessment & Plan: Having night time wakening with symptoms. Reviewed best way to take PPI before meals. Continue BID PPI and sent sucralfate  refills prn. Plan on GI eval for EGD.  Orders: -     Lipase -     Comprehensive metabolic panel with GFR -     Sucralfate ; Take 1 tablet (1 g total) by mouth every 8 (eight) hours as needed for up to 10 days.   Dispense: 30 tablet; Refill: 0 -     Ambulatory referral to Gastroenterology  Prediabetes Assessment & Plan: Given dry mouth will repeat levels.   Orders: -     Hemoglobin A1c  Cigarette nicotine  dependence without complication Assessment & Plan: Discussed cessation and available treatment including nicotine  replacement options, pharmacologic treatment, and/or online resources. He has not been able to pick up nicotine  inhalers. Will call pharmacy. He did discuss prior alcohol use hx and feels smoking prevents from drinking. Consider naltrexone in future. Total time spent on discussion: 12 minutes.       Follow up plan: Return in about 2 weeks (around 08/16/2024) for HTN.  Hadassah SHAUNNA Nett, MD

## 2024-08-02 NOTE — Assessment & Plan Note (Signed)
 Given dry mouth will repeat levels.

## 2024-08-02 NOTE — Assessment & Plan Note (Signed)
 Previously normal in office. Plan to check 1-2 weeks then return to clinic and reassess. Suspect stress related change but will make adjustments based on home readings at follow up.

## 2024-08-02 NOTE — Assessment & Plan Note (Signed)
 Seen on recent blood work. He did take iron supp for a few weeks then stopped. Plan to repeat today.

## 2024-08-02 NOTE — Assessment & Plan Note (Signed)
 Having night time wakening with symptoms. Reviewed best way to take PPI before meals. Continue BID PPI and sent sucralfate  refills prn. Plan on GI eval for EGD.

## 2024-08-02 NOTE — Assessment & Plan Note (Signed)
 Discussed cessation and available treatment including nicotine  replacement options, pharmacologic treatment, and/or online resources. He has not been able to pick up nicotine  inhalers. Will call pharmacy. He did discuss prior alcohol use hx and feels smoking prevents from drinking. Consider naltrexone in future. Total time spent on discussion: 12 minutes.

## 2024-08-02 NOTE — Patient Instructions (Addendum)
 Plan: Be sure to take the omeprazole  twice daily - ideally 15-20 minutes before your two largest meals of the day I placed a referral to GI to see if you need an EGD (scope of the throat) I sent sucralfate  as needed - try to minimize use of this  Keep taking the amlodipine  5mg  daily  Measure your blood pressure at home! Home Blood Pressure Monitoring is an important part of managing blood pressure and thought to be more accurate than the measures we get in the clinic.  Here's some tips on how to take your blood pressure accurately at home and some highly rated monitors. Most insurances (except for Medicaid) won't pay for monitors, so unfortunately they are an out-of-pocket expense for most people.  Taking an accurate blood pressure measurement: To get an accurate blood pressure reading, empty your bladder first, then rest in a seated position for at least 5 minutes. Ideally, no caffeine or tobacco use in last 30 minutes. Use an arm cuff (not wrist - see recommendations below) seated in a chair with a back next to a table or object that is high enough that you can rest your arm so the blood pressure cuff is at the level of your heart and you can lean back comfortably. Keep both feet on the floor and don't talk while the machine is working. Checking at different times of the day can be helpful to get an idea of your average numbers. Your goal blood pressure should be <140/90.

## 2024-08-03 LAB — CBC WITH DIFFERENTIAL/PLATELET
Basophils Absolute: 0.1 x10E3/uL (ref 0.0–0.2)
Basos: 1 %
EOS (ABSOLUTE): 0.5 x10E3/uL — ABNORMAL HIGH (ref 0.0–0.4)
Eos: 6 %
Hematocrit: 42.2 % (ref 37.5–51.0)
Hemoglobin: 13.1 g/dL (ref 13.0–17.7)
Immature Grans (Abs): 0 x10E3/uL (ref 0.0–0.1)
Immature Granulocytes: 0 %
Lymphocytes Absolute: 2.8 x10E3/uL (ref 0.7–3.1)
Lymphs: 33 %
MCH: 26 pg — ABNORMAL LOW (ref 26.6–33.0)
MCHC: 31 g/dL — ABNORMAL LOW (ref 31.5–35.7)
MCV: 84 fL (ref 79–97)
Monocytes Absolute: 0.6 x10E3/uL (ref 0.1–0.9)
Monocytes: 7 %
Neutrophils Absolute: 4.6 x10E3/uL (ref 1.4–7.0)
Neutrophils: 53 %
Platelets: 306 x10E3/uL (ref 150–450)
RBC: 5.04 x10E6/uL (ref 4.14–5.80)
RDW: 17.5 % — ABNORMAL HIGH (ref 11.6–15.4)
WBC: 8.7 x10E3/uL (ref 3.4–10.8)

## 2024-08-03 LAB — IRON,TIBC AND FERRITIN PANEL
Ferritin: 19 ng/mL — ABNORMAL LOW (ref 30–400)
Iron Saturation: 9 % — CL (ref 15–55)
Iron: 41 ug/dL (ref 38–169)
Total Iron Binding Capacity: 463 ug/dL — ABNORMAL HIGH (ref 250–450)
UIBC: 422 ug/dL — ABNORMAL HIGH (ref 111–343)

## 2024-08-03 LAB — COMPREHENSIVE METABOLIC PANEL WITH GFR
ALT: 40 IU/L (ref 0–44)
AST: 30 IU/L (ref 0–40)
Albumin: 4.5 g/dL (ref 4.1–5.1)
Alkaline Phosphatase: 103 IU/L (ref 44–121)
BUN/Creatinine Ratio: 12 (ref 9–20)
BUN: 13 mg/dL (ref 6–24)
Bilirubin Total: 0.3 mg/dL (ref 0.0–1.2)
CO2: 20 mmol/L (ref 20–29)
Calcium: 9.6 mg/dL (ref 8.7–10.2)
Chloride: 100 mmol/L (ref 96–106)
Creatinine, Ser: 1.07 mg/dL (ref 0.76–1.27)
Globulin, Total: 3.1 g/dL (ref 1.5–4.5)
Glucose: 96 mg/dL (ref 70–99)
Potassium: 4.3 mmol/L (ref 3.5–5.2)
Sodium: 136 mmol/L (ref 134–144)
Total Protein: 7.6 g/dL (ref 6.0–8.5)
eGFR: 90 mL/min/1.73 (ref >59)

## 2024-08-03 LAB — HEMOGLOBIN A1C
Est. average glucose Bld gHb Est-mCnc: 123 mg/dL
Hgb A1c MFr Bld: 5.9 % — ABNORMAL HIGH (ref 4.8–5.6)

## 2024-08-03 LAB — LIPASE: Lipase: 75 U/L (ref 13–78)

## 2024-08-05 ENCOUNTER — Ambulatory Visit: Payer: Self-pay | Admitting: Pediatrics

## 2024-08-23 ENCOUNTER — Other Ambulatory Visit: Payer: Self-pay | Admitting: Pediatrics

## 2024-08-23 ENCOUNTER — Ambulatory Visit: Admitting: Pediatrics

## 2024-08-23 ENCOUNTER — Ambulatory Visit: Payer: Self-pay | Admitting: Pediatrics

## 2024-08-23 VITALS — BP 133/82 | HR 98 | Temp 98.0°F | Wt 215.4 lb

## 2024-08-23 DIAGNOSIS — K219 Gastro-esophageal reflux disease without esophagitis: Secondary | ICD-10-CM

## 2024-08-23 DIAGNOSIS — K921 Melena: Secondary | ICD-10-CM | POA: Diagnosis not present

## 2024-08-23 DIAGNOSIS — D509 Iron deficiency anemia, unspecified: Secondary | ICD-10-CM | POA: Diagnosis not present

## 2024-08-23 DIAGNOSIS — R399 Unspecified symptoms and signs involving the genitourinary system: Secondary | ICD-10-CM

## 2024-08-23 DIAGNOSIS — N3000 Acute cystitis without hematuria: Secondary | ICD-10-CM

## 2024-08-23 DIAGNOSIS — I10 Essential (primary) hypertension: Secondary | ICD-10-CM

## 2024-08-23 LAB — MICROSCOPIC EXAMINATION

## 2024-08-23 LAB — URINALYSIS, ROUTINE W REFLEX MICROSCOPIC
Bilirubin, UA: NEGATIVE
Glucose, UA: NEGATIVE
Ketones, UA: NEGATIVE
Leukocytes,UA: NEGATIVE
Nitrite, UA: NEGATIVE
Specific Gravity, UA: 1.02 (ref 1.005–1.030)
Urobilinogen, Ur: 0.2 mg/dL (ref 0.2–1.0)
pH, UA: 6 (ref 5.0–7.5)

## 2024-08-23 MED ORDER — CIPROFLOXACIN HCL 500 MG PO TABS: 500.0000 mg | ORAL_TABLET | Freq: Two times a day (BID) | ORAL | 0 refills | Status: AC

## 2024-08-23 MED ORDER — AMLODIPINE BESYLATE 5 MG PO TABS: 5.0000 mg | ORAL_TABLET | Freq: Every day | ORAL | 3 refills | Status: AC

## 2024-08-23 MED ORDER — OMEPRAZOLE 40 MG PO CPDR: 40.0000 mg | DELAYED_RELEASE_CAPSULE | Freq: Two times a day (BID) | ORAL | 3 refills | Status: AC

## 2024-08-23 NOTE — Patient Instructions (Signed)
 If symptoms recur, please go to the ER  I will message you once we have the results back of the testing today

## 2024-08-23 NOTE — Progress Notes (Signed)
 Office Visit  BP 133/82   Pulse 98   Temp 98 F (36.7 C) (Oral)   Wt 215 lb 6.4 oz (97.7 kg)   SpO2 96%   BMI 33.74 kg/m    Subjective:    Patient ID: Lance Hart, male    DOB: 07-Feb-1986, 38 y.o.   MRN: [id number]  HPI: Lance Hart is a 38 y.o. male  Chief Complaint  Patient presents with   Hypertension   Dysuria    Burning when urinate been going on since last  Thursday   Abdominal Pain    Discussed the use of AI scribe software for clinical note transcription with the patient, who gave verbal consent to proceed.  History of Present Illness   Lance Hart is a 38 year old male who presents with abdominal pain and urinary symptoms.  He has been experiencing abdominal pain since last Thursday, which began after attending football practice. The pain is described as a pressure in the abdominal area, worsening when lying on his back, and is associated with a burning sensation during urination. He attempted to alleviate his symptoms with a mixture of beet, watermelon, and ginger, which led to black diarrhea that later turned brown with red streaks upon wiping. The abdominal pain persists, especially when straining during bowel movements.  He has been taking AZO for two days, two pills a day, which has helped reduce the burning sensation during urination. He also took his usual stomach medications, including a large white pill and an acid pill, but these did not alleviate his symptoms. His bowel movements have returned to a brown color, but he still experiences soreness in the abdominal area. No discoloration in the testicles is reported.  He has not had any new sexual partners and initially thought he might have 'blue balls' due to the nature of the pain. He is busy with work and family commitments, including his son's football practice, which limits his time for other activities. He is currently out of his blood pressure medication and one of his stomach medications, but has  recently refilled the large white pill. He mentions that his nicotine  inhaler is no longer available at [facility].  His aunt recently passed away from complications related to a stomach issue, which has heightened his concern about his own symptoms.        Relevant past medical, surgical, family and social history reviewed and updated as indicated. Interim medical history since our last visit reviewed. Allergies and medications reviewed and updated.  ROS per HPI unless specifically indicated above     Objective:    BP 133/82   Pulse 98   Temp 98 F (36.7 C) (Oral)   Wt 215 lb 6.4 oz (97.7 kg)   SpO2 96%   BMI 33.74 kg/m   Wt Readings from Last 3 Encounters:  08/23/24 215 lb 6.4 oz (97.7 kg)  08/02/24 223 lb 9.6 oz (101.4 kg)  04/24/24 217 lb 12.8 oz (98.8 kg)     Physical Exam Constitutional:      Appearance: Normal appearance.  Pulmonary:     Effort: Pulmonary effort is normal.  Abdominal:     General: There is distension.     Palpations: Abdomen is soft. There is no shifting dullness.     Tenderness: There is no abdominal tenderness. There is no guarding or rebound. Negative signs include Murphy's sign.     Hernia: No hernia is present.  Musculoskeletal:        General: Normal  range of motion.  Skin:    Comments: Normal skin color  Neurological:     General: No focal deficit present.     Mental Status: He is alert. Mental status is at baseline.  Psychiatric:        Mood and Affect: Mood normal.        Behavior: Behavior normal.        Thought Content: Thought content normal.         08/27/2024    9:30 AM 08/02/2024    8:09 AM 04/24/2024    1:16 PM  Depression screen PHQ 2/9  Decreased Interest 0 0 0  Down, Depressed, Hopeless 0 0 0  PHQ - 2 Score 0 0 0  Altered sleeping 0 0 3  Tired, decreased energy 0 0 0  Change in appetite 0 0 0  Feeling bad or failure about yourself  0 0 0  Trouble concentrating 0 0 0  Moving slowly or fidgety/restless 0 0 0   Suicidal thoughts 0 0 0  PHQ-9 Score 0 0 3  Difficult doing work/chores Not difficult at all Not difficult at all Not difficult at all       08/27/2024    9:30 AM 08/02/2024    8:10 AM 04/24/2024    1:16 PM  GAD 7 : Generalized Anxiety Score  Nervous, Anxious, on Edge 0 0 0  Control/stop worrying 0 0 0  Worry too much - different things 0 0 2  Trouble relaxing 0 0 2  Restless 0 0 0  Easily annoyed or irritable 0 0 0  Afraid - awful might happen 0 0 2  Total GAD 7 Score 0 0 6  Anxiety Difficulty Not difficult at all Not difficult at all Not difficult at all       Assessment & Plan:  Assessment & Plan   Melena Abdominal pain, distension with melena. Has also had BRBPR suggestive on GI bleed, possibly from GERD or hemorrhoids. HD stable. - Expedite GI referral for endoscopy. - Advise ER visit if symptoms worsen, especially with significant pain or bleeding. - Order labs for hemoglobin and iron levels. -     Ambulatory referral to Gastroenterology  Primary hypertension Assessment & Plan: Blood pressure well-controlled on current regimen. - Continue current blood pressure medication, amlodipine  5mg   Orders: -     Comprehensive metabolic panel with GFR -     amLODIPine  Besylate; Take 1 tablet (5 mg total) by mouth daily.  Dispense: 90 tablet; Refill: 3  Gastroesophageal reflux disease, unspecified whether esophagitis present Assessment & Plan: Concerned about upper GI bleed given melena complaints. Plan to continue omeprazole  and attempt to expedite GI referral as above.   Orders: -     Omeprazole ; Take 1 capsule (40 mg total) by mouth 2 (two) times daily before a meal.  Dispense: 60 capsule; Refill: 3 -     Ambulatory referral to Gastroenterology  Iron deficiency anemia, unspecified iron deficiency anemia type Assessment & Plan: Concern for iron deficiency anemia due to GI bleeding. - Order labs for hemoglobin and iron levels.  Orders: -     CBC with  Differential/Platelet -     Iron, TIBC and Ferritin Panel  Urinary tract infection symptoms Symptoms of dysuria and abdominal pain suggest UTI. Azo provided temporary relief, but symptoms persist. - Order urine test to confirm UTI.  -     Urinalysis, Routine w reflex microscopic -     Urine Culture -  Microscopic Examination   Follow up plan: Return in about 2 months (around 10/23/2024) for Physical.  Hadassah SHAUNNA Nett, MD

## 2024-08-23 NOTE — Progress Notes (Signed)
 Urinalysis w few bacteria likely from recent azo, he continues to have urinary symptoms so will plan to treat with cipro . Urine culture pending.  Lance SHAUNNA Nett, MD

## 2024-08-24 LAB — COMPREHENSIVE METABOLIC PANEL WITH GFR
ALT: 54 IU/L — ABNORMAL HIGH (ref 0–44)
AST: 24 IU/L (ref 0–40)
Albumin: 4.2 g/dL (ref 4.1–5.1)
Alkaline Phosphatase: 115 IU/L (ref 44–121)
BUN/Creatinine Ratio: 8 — ABNORMAL LOW (ref 9–20)
BUN: 9 mg/dL (ref 6–24)
Bilirubin Total: 0.4 mg/dL (ref 0.0–1.2)
CO2: 23 mmol/L (ref 20–29)
Calcium: 9.6 mg/dL (ref 8.7–10.2)
Chloride: 98 mmol/L (ref 96–106)
Creatinine, Ser: 1.07 mg/dL (ref 0.76–1.27)
Globulin, Total: 3.5 g/dL (ref 1.5–4.5)
Glucose: 90 mg/dL (ref 70–99)
Potassium: 4.2 mmol/L (ref 3.5–5.2)
Sodium: 140 mmol/L (ref 134–144)
Total Protein: 7.7 g/dL (ref 6.0–8.5)
eGFR: 90 mL/min/1.73 (ref >59)

## 2024-08-24 LAB — IRON,TIBC AND FERRITIN PANEL
Ferritin: 132 ng/mL (ref 30–400)
Iron Saturation: 3 % — CL (ref 15–55)
Iron: 11 ug/dL — ABNORMAL LOW (ref 38–169)
Total Iron Binding Capacity: 361 ug/dL (ref 250–450)
UIBC: 350 ug/dL — ABNORMAL HIGH (ref 111–343)

## 2024-08-24 LAB — CBC WITH DIFFERENTIAL/PLATELET
Basophils Absolute: 0.1 x10E3/uL (ref 0.0–0.2)
Basos: 1 %
EOS (ABSOLUTE): 0.2 x10E3/uL (ref 0.0–0.4)
Eos: 1 %
Hematocrit: 38.4 % (ref 37.5–51.0)
Hemoglobin: 11.9 g/dL — ABNORMAL LOW (ref 13.0–17.7)
Immature Grans (Abs): 0.2 x10E3/uL — ABNORMAL HIGH (ref 0.0–0.1)
Immature Granulocytes: 1 %
Lymphocytes Absolute: 3 x10E3/uL (ref 0.7–3.1)
Lymphs: 17 %
MCH: 25.8 pg — ABNORMAL LOW (ref 26.6–33.0)
MCHC: 31 g/dL — ABNORMAL LOW (ref 31.5–35.7)
MCV: 83 fL (ref 79–97)
Monocytes Absolute: 1.3 x10E3/uL — ABNORMAL HIGH (ref 0.1–0.9)
Monocytes: 8 %
Neutrophils Absolute: 12.5 x10E3/uL — ABNORMAL HIGH (ref 1.4–7.0)
Neutrophils: 72 %
Platelets: 393 x10E3/uL (ref 150–450)
RBC: 4.61 x10E6/uL (ref 4.14–5.80)
RDW: 16.2 % — ABNORMAL HIGH (ref 11.6–15.4)
WBC: 17.3 x10E3/uL — ABNORMAL HIGH (ref 3.4–10.8)

## 2024-08-25 LAB — URINE CULTURE

## 2024-08-28 ENCOUNTER — Other Ambulatory Visit: Payer: Self-pay | Admitting: Pediatrics

## 2024-08-28 DIAGNOSIS — D72829 Elevated white blood cell count, unspecified: Secondary | ICD-10-CM

## 2024-08-28 NOTE — Progress Notes (Signed)
 Placed future lab orders.  Lance SHAUNNA Nett, MD

## 2024-08-29 NOTE — Progress Notes (Signed)
 Scheduled

## 2024-08-30 ENCOUNTER — Other Ambulatory Visit

## 2024-09-01 NOTE — Assessment & Plan Note (Signed)
 Blood pressure well-controlled on current regimen. - Continue current blood pressure medication, amlodipine  5mg 

## 2024-09-01 NOTE — Assessment & Plan Note (Signed)
 Concerned about upper GI bleed given melena complaints. Plan to continue omeprazole  and attempt to expedite GI referral as above.

## 2024-09-01 NOTE — Assessment & Plan Note (Signed)
 Concern for iron deficiency anemia due to GI bleeding. - Order labs for hemoglobin and iron levels.

## 2024-09-06 ENCOUNTER — Other Ambulatory Visit

## 2024-09-06 DIAGNOSIS — D72829 Elevated white blood cell count, unspecified: Secondary | ICD-10-CM

## 2024-09-07 LAB — CBC WITH DIFFERENTIAL/PLATELET
Basophils Absolute: 0.1 x10E3/uL (ref 0.0–0.2)
Basos: 1 %
EOS (ABSOLUTE): 0.2 x10E3/uL (ref 0.0–0.4)
Eos: 3 %
Hematocrit: 35.7 % — ABNORMAL LOW (ref 37.5–51.0)
Hemoglobin: 11.3 g/dL — ABNORMAL LOW (ref 13.0–17.7)
Immature Grans (Abs): 0 x10E3/uL (ref 0.0–0.1)
Immature Granulocytes: 0 %
Lymphocytes Absolute: 3 x10E3/uL (ref 0.7–3.1)
Lymphs: 43 %
MCH: 26 pg — ABNORMAL LOW (ref 26.6–33.0)
MCHC: 31.7 g/dL (ref 31.5–35.7)
MCV: 82 fL (ref 79–97)
Monocytes Absolute: 0.4 x10E3/uL (ref 0.1–0.9)
Monocytes: 6 %
Neutrophils Absolute: 3.3 x10E3/uL (ref 1.4–7.0)
Neutrophils: 47 %
Platelets: 545 x10E3/uL — ABNORMAL HIGH (ref 150–450)
RBC: 4.35 x10E6/uL (ref 4.14–5.80)
RDW: 16.8 % — ABNORMAL HIGH (ref 11.6–15.4)
WBC: 7.1 x10E3/uL (ref 3.4–10.8)

## 2024-09-07 LAB — COMPREHENSIVE METABOLIC PANEL WITH GFR
ALT: 42 IU/L (ref 0–44)
AST: 20 IU/L (ref 0–40)
Albumin: 3.8 g/dL — ABNORMAL LOW (ref 4.1–5.1)
Alkaline Phosphatase: 100 IU/L (ref 44–121)
BUN/Creatinine Ratio: 11 (ref 9–20)
BUN: 8 mg/dL (ref 6–24)
Bilirubin Total: 0.2 mg/dL (ref 0.0–1.2)
CO2: 25 mmol/L (ref 20–29)
Calcium: 9.6 mg/dL (ref 8.7–10.2)
Chloride: 103 mmol/L (ref 96–106)
Creatinine, Ser: 0.72 mg/dL — ABNORMAL LOW (ref 0.76–1.27)
Globulin, Total: 3.6 g/dL (ref 1.5–4.5)
Glucose: 86 mg/dL (ref 70–99)
Potassium: 5.1 mmol/L (ref 3.5–5.2)
Sodium: 140 mmol/L (ref 134–144)
Total Protein: 7.4 g/dL (ref 6.0–8.5)
eGFR: 118 mL/min/1.73 (ref >59)

## 2024-09-11 ENCOUNTER — Ambulatory Visit: Payer: Self-pay | Admitting: Pediatrics

## 2024-10-29 ENCOUNTER — Encounter: Payer: Self-pay | Admitting: Gastroenterology

## 2024-11-01 ENCOUNTER — Encounter: Admission: RE | Disposition: A | Payer: Self-pay | Source: Home / Self Care | Attending: Gastroenterology

## 2024-11-01 ENCOUNTER — Encounter: Payer: Self-pay | Admitting: Anesthesiology

## 2024-11-01 ENCOUNTER — Other Ambulatory Visit: Payer: Self-pay

## 2024-11-01 ENCOUNTER — Ambulatory Visit
Admission: RE | Admit: 2024-11-01 | Discharge: 2024-11-01 | Disposition: A | Discharge status: Home / Self Care | Attending: Gastroenterology | Admitting: Gastroenterology

## 2024-11-01 ENCOUNTER — Ambulatory Visit: Admitting: Pediatrics

## 2024-11-01 ENCOUNTER — Ambulatory Visit: Payer: Self-pay | Admitting: Anesthesiology

## 2024-11-01 ENCOUNTER — Encounter: Payer: Self-pay | Admitting: Gastroenterology

## 2024-11-01 DIAGNOSIS — D509 Iron deficiency anemia, unspecified: Secondary | ICD-10-CM | POA: Insufficient documentation

## 2024-11-01 DIAGNOSIS — I10 Essential (primary) hypertension: Secondary | ICD-10-CM | POA: Insufficient documentation

## 2024-11-01 DIAGNOSIS — Z79899 Other long term (current) drug therapy: Secondary | ICD-10-CM | POA: Diagnosis not present

## 2024-11-01 DIAGNOSIS — K2950 Unspecified chronic gastritis without bleeding: Secondary | ICD-10-CM | POA: Diagnosis not present

## 2024-11-01 DIAGNOSIS — K259 Gastric ulcer, unspecified as acute or chronic, without hemorrhage or perforation: Secondary | ICD-10-CM | POA: Diagnosis not present

## 2024-11-01 DIAGNOSIS — K449 Diaphragmatic hernia without obstruction or gangrene: Secondary | ICD-10-CM | POA: Diagnosis not present

## 2024-11-01 DIAGNOSIS — K2100 Gastro-esophageal reflux disease with esophagitis, without bleeding: Secondary | ICD-10-CM | POA: Insufficient documentation

## 2024-11-01 DIAGNOSIS — B9681 Helicobacter pylori [H. pylori] as the cause of diseases classified elsewhere: Secondary | ICD-10-CM | POA: Insufficient documentation

## 2024-11-01 DIAGNOSIS — K5730 Diverticulosis of large intestine without perforation or abscess without bleeding: Secondary | ICD-10-CM | POA: Insufficient documentation

## 2024-11-01 DIAGNOSIS — F17210 Nicotine dependence, cigarettes, uncomplicated: Secondary | ICD-10-CM | POA: Diagnosis not present

## 2024-11-01 DIAGNOSIS — Z8673 Personal history of transient ischemic attack (TIA), and cerebral infarction without residual deficits: Secondary | ICD-10-CM | POA: Insufficient documentation

## 2024-11-01 DIAGNOSIS — F17290 Nicotine dependence, other tobacco product, uncomplicated: Secondary | ICD-10-CM | POA: Diagnosis not present

## 2024-11-01 HISTORY — DX: Sleep apnea, unspecified: G47.30

## 2024-11-01 HISTORY — PX: ESOPHAGOGASTRODUODENOSCOPY: SHX5428

## 2024-11-01 HISTORY — DX: Gastro-esophageal reflux disease without esophagitis: K21.9

## 2024-11-01 HISTORY — DX: Cerebral infarction, unspecified: I63.9

## 2024-11-01 HISTORY — DX: Unspecified asthma, uncomplicated: J45.909

## 2024-11-01 HISTORY — PX: COLONOSCOPY: SHX5424

## 2024-11-01 SURGERY — COLONOSCOPY
Anesthesia: General | Site: Rectum

## 2024-11-01 MED ORDER — SODIUM CHLORIDE 0.9 % IV SOLN: INTRAVENOUS | Status: DC

## 2024-11-01 MED ORDER — GLYCOPYRROLATE 0.2 MG/ML IJ SOLN
INTRAMUSCULAR | Status: DC | PRN
  Administered 2024-11-01: .2 mg via INTRAVENOUS

## 2024-11-01 MED ORDER — LIDOCAINE HCL (CARDIAC) PF 100 MG/5ML IV SOSY
PREFILLED_SYRINGE | INTRAVENOUS | Status: DC | PRN
  Administered 2024-11-01: 50 mg via INTRAVENOUS

## 2024-11-01 MED ORDER — STERILE WATER FOR IRRIGATION IR SOLN
Status: DC | PRN
  Administered 2024-11-01: 1

## 2024-11-01 MED ORDER — DEXMEDETOMIDINE HCL IN NACL 80 MCG/20ML IV SOLN
INTRAVENOUS | Status: AC
  Filled 2024-11-01: qty 20

## 2024-11-01 MED ORDER — DEXMEDETOMIDINE HCL IN NACL 80 MCG/20ML IV SOLN
INTRAVENOUS | Status: DC | PRN
  Administered 2024-11-01: 8 ug via INTRAVENOUS

## 2024-11-01 MED ORDER — PROPOFOL 10 MG/ML IV BOLUS
INTRAVENOUS | Status: DC | PRN
  Administered 2024-11-01: 30 mg via INTRAVENOUS
  Administered 2024-11-01 (×3): 50 mg via INTRAVENOUS
  Administered 2024-11-01 (×2): 30 mg via INTRAVENOUS
  Administered 2024-11-01: 100 mg via INTRAVENOUS
  Administered 2024-11-01: 40 mg via INTRAVENOUS
  Administered 2024-11-01: 20 mg via INTRAVENOUS

## 2024-11-01 SURGICAL SUPPLY — 7 items
BLOCK BITE 60FR ADLT L/F GRN (MISCELLANEOUS) ×2 IMPLANT
FORCEPS BIOP RAD 4 LRG CAP 4 (CUTTING FORCEPS) IMPLANT
GOWN CVR UNV OPN BCK APRN NK (MISCELLANEOUS) ×4 IMPLANT
KIT PRC NS LF DISP ENDO (KITS) ×2 IMPLANT
KIT PROCEDURE OLYMPUS (MISCELLANEOUS) ×2 IMPLANT
MANIFOLD NEPTUNE II (INSTRUMENTS) ×2 IMPLANT
WATER STERILE IRR 250ML POUR (IV SOLUTION) ×2 IMPLANT

## 2024-11-01 NOTE — H&P (Signed)
 Lance JONELLE Brooklyn, MD [facility], [address]  [place], [place] [address]  Main: [phone] Fax:  [phone] Pager: [phone]   Primary Care Physician:  Herold Hadassah SQUIBB, MD Primary Gastroenterologist:  Dr. Corinn JONELLE Hart  Pre-Procedure History & Physical: HPI:  Lance Hart is a 38 y.o. male is here for an endoscopy and colonoscopy.   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    History reviewed. No pertinent surgical history.  Prior to Admission medications   Medication Sig Start Date End Date Taking? Authorizing Provider  ferrous sulfate 325 (65 FE) MG EC tablet Take 325 mg by mouth daily with breakfast.   Yes [provider]  Multiple Vitamins-Minerals (MULTIVITAMIN WITH MINERALS) tablet Take 1 tablet by mouth daily.   Yes [provider]  amLODipine  (NORVASC ) 5 MG tablet Take 1 tablet (5 mg total) by mouth daily. 08/23/24 08/23/25  Herold Hadassah SQUIBB, MD  omeprazole  (PRILOSEC ) 40 MG capsule Take 1 capsule (40 mg total) by mouth 2 (two) times daily before a meal. 08/23/24   Herold Hadassah SQUIBB, MD  sucralfate  (CARAFATE ) 1 g tablet Take 1 tablet (1 g total) by mouth every 8 (eight) hours as needed for up to 10 days. 08/02/24 08/12/24  Herold Hadassah SQUIBB, MD    Allergies as of 10/24/2024   (No Known Allergies)    Family History  Problem Relation Age of Onset   Hypertension Mother    Cancer Mother    Hypertension Father     Social History   Socioeconomic History   Marital status: Single    Spouse name: Not on file   Number of children: Not on file   Years of education: Not on file   Highest education level: Not on file  Occupational History   Not on file  Tobacco Use   Smoking status: Every Day    Types: Cigarettes    Smokeless tobacco: Never  Vaping Use   Vaping status: Some Days   Substances: Nicotine   Substance and Sexual Activity   Alcohol use: Yes    Alcohol/week: 3.0 standard drinks of alcohol    Types: 3 Cans of beer per week    Comment: occaisionally on Sat or Sunday   Drug use: Never   Sexual activity: Yes  Other Topics Concern   Not on file  Social History Narrative   Not on file   Social Drivers of Health   Financial Resource Strain: Not on file  Food Insecurity: Not on file  Transportation Needs: Not on file  Physical Activity: Not on file  Stress: Not on file  Social Connections: Not on file  Intimate Partner Violence: Not on file    Review of Systems: See HPI, otherwise negative ROS  Physical Exam: BP (!) 132/97   Pulse 90   Temp 98.1 F (36.7 C)   Resp 13   Wt 91.2 kg   SpO2 98%   BMI 31.48 kg/m  General:   Alert,  pleasant and cooperative in NAD Head:  Normocephalic and atraumatic. Neck:  Supple; no masses or thyromegaly. Lungs:  Clear throughout to auscultation.    Heart:  Regular rate and rhythm. Abdomen:  Soft, nontender and nondistended. Normal bowel sounds, without guarding, and without rebound.   Neurologic:  Alert and  oriented x4;  grossly normal neurologically.  Impression/Plan: Lance Hart is here for an endoscopy and colonoscopy to be performed for chronic IDA  Risks, benefits, limitations, and alternatives regarding  endoscopy and colonoscopy have been reviewed with the patient.  Questions have been answered.  All parties agreeable.   Lance Brooklyn, MD  11/01/2024, 8:35 AM

## 2024-11-01 NOTE — Anesthesia Postprocedure Evaluation (Signed)
 Anesthesia Post Note  Patient: Lance Hart  Procedure(s) Performed: COLONOSCOPY (Rectum) EGD (ESOPHAGOGASTRODUODENOSCOPY) WITH BIOPSY (Mouth)  Patient location during evaluation: PACU Anesthesia Type: General Level of consciousness: awake and alert Pain management: pain level controlled Vital Signs Assessment: post-procedure vital signs reviewed and stable Respiratory status: spontaneous breathing, nonlabored ventilation, respiratory function stable and patient connected to nasal cannula oxygen Cardiovascular status: blood pressure returned to baseline and stable Postop Assessment: no apparent nausea or vomiting Anesthetic complications: no   No notable events documented.   Last Vitals:  Vitals:   11/01/24 0911 11/01/24 0927  BP: (!) 112/95 124/83  Pulse: (!) 12 (!) 105  Resp: 18 (!) 22  Temp: (!) 36.1 C 36.7 C  SpO2: 100% 100%    Last Pain:  Vitals:   11/01/24 0927  PainSc: 0-No pain                 [name]

## 2024-11-01 NOTE — Op Note (Signed)
 [facility] Gastroenterology Patient Name: Lance Hart Procedure Date: 11/01/2024 8:36 AM MRN: [id number] Account #: [id number] Date of Birth: 1986-03-19 Admit Type: Outpatient Age: 38 Room: [facility] OR ROOM 01 Gender: Male Note Status: Finalized Instrument Name: Endoscope [id number] Procedure:             Upper GI endoscopy Indications:           Unexplained iron deficiency anemia Providers:             Corinn Jess Brooklyn MD, MD Referring MD:          Hadassah SQUIBB. Herold (Referring MD) Medicines:             General Anesthesia Complications:         No immediate complications. Estimated blood loss: None. Procedure:             Pre-Anesthesia Assessment:                        - Prior to the procedure, a History and Physical was                         performed, and patient medications and allergies were                         reviewed. The patient is competent. The risks and                         benefits of the procedure and the sedation options and                         risks were discussed with the patient. All questions                         were answered and informed consent was obtained.                         Patient identification and proposed procedure were                         verified by the physician, the nurse, the                         anesthesiologist, the anesthetist and the technician                         in the pre-procedure area in the procedure room in the                         endoscopy suite. Mental Status Examination: alert and                         oriented. Airway Examination: normal oropharyngeal                         airway and neck mobility. Respiratory Examination:                         clear to auscultation. CV Examination: normal.  Prophylactic Antibiotics: The patient does not require                         prophylactic antibiotics. Prior Anticoagulants: The                         patient has  taken no anticoagulant or antiplatelet                         agents. ASA Grade Assessment: II - A patient with mild                         systemic disease. After reviewing the risks and                         benefits, the patient was deemed in satisfactory                         condition to undergo the procedure. The anesthesia                         plan was to use general anesthesia. Immediately prior                         to administration of medications, the patient was                         re-assessed for adequacy to receive sedatives. The                         heart rate, respiratory rate, oxygen saturations,                         blood pressure, adequacy of pulmonary ventilation, and                         response to care were monitored throughout the                         procedure. The physical status of the patient was                         re-assessed after the procedure.                        After obtaining informed consent, the endoscope was                         passed under direct vision. Throughout the procedure,                         the patient's blood pressure, pulse, and oxygen                         saturations were monitored continuously. The Endoscope                         was introduced through the mouth, and advanced to the  second part of duodenum. The upper GI endoscopy was                         accomplished without difficulty. The patient tolerated                         the procedure well. Findings:      The duodenal bulb and second portion of the duodenum were normal.      A medium-sized hiatal hernia with multiple Cameron erosions was found.       The Z-line was 36 cm from the incisors.      The entire examined stomach was normal. Biopsies were taken with a cold       forceps for histology.      The cardia and gastric fundus were normal on retroflexion.      LA Grade C (one or more mucosal breaks  continuous between tops of 2 or       more mucosal folds, less than 75% circumference) esophagitis with no       bleeding was found in the distal esophagus and gastroesophageal       junction. Biopsies were taken with a cold forceps for histology. Impression:            - Normal duodenal bulb and second portion of the                         duodenum.                        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                        - Normal stomach. Biopsied.                        - LA Grade C reflux esophagitis with no bleeding.                         Biopsied. Recommendation:        - Await pathology results.                        - Follow an antireflux regimen indefinitely.                        - Continue present medications.                        - Proceed with colonoscopy as scheduled                        See colonoscopy report Procedure Code(s):     --- Professional ---                        [id number], Esophagogastroduodenoscopy, flexible,                         transoral; with biopsy, single or multiple Diagnosis Code(s):     --- Professional ---  K44.9, Diaphragmatic hernia without obstruction or                         gangrene                        K25.9, Gastric ulcer, unspecified as acute or chronic,                         without hemorrhage or perforation                        K21.00, Gastro-esophageal reflux disease with                         esophagitis, without bleeding                        D50.9, Iron deficiency anemia, unspecified CPT copyright 2022 American Medical Association. All rights reserved. The codes documented in this report are preliminary and upon coder review may  be revised to meet current compliance requirements. Dr. Corinn Brooklyn Corinn Jess Brooklyn MD, MD 11/01/2024 8:57:04 AM This report has been signed electronically. Number of Addenda: 0 Note Initiated On: 11/01/2024  8:36 AM Total Procedure Duration: 0 hours 5 minutes 28 seconds  Estimated Blood Loss:  Estimated blood loss: none.      [facility]

## 2024-11-01 NOTE — Transfer of Care (Signed)
 Immediate Anesthesia Transfer of Care Note  Patient: Lance Hart  Procedure(s) Performed: COLONOSCOPY (Rectum) EGD (ESOPHAGOGASTRODUODENOSCOPY) WITH BIOPSY (Mouth)  Patient Location: PACU  Anesthesia Type: General  Level of Consciousness: awake, alert  and patient cooperative  Airway and Oxygen Therapy: Patient Spontanous Breathing and Patient connected to supplemental oxygen  Post-op Assessment: Post-op Vital signs reviewed, Patient's Cardiovascular Status Stable, Respiratory Function Stable, Patent Airway and No signs of Nausea or vomiting  Post-op Vital Signs: Reviewed and stable  Complications: No notable events documented.

## 2024-11-01 NOTE — Op Note (Signed)
 [facility] Gastroenterology Patient Name: Lance Hart Procedure Date: 11/01/2024 8:35 AM MRN: [id number] Account #: [id number] Date of Birth: [birth year] Admit Type: Outpatient Age: 38 Room: [facility] OR ROOM 01 Gender: Male Note Status: Finalized Instrument Name: Colonoscope [id number] Procedure:             Colonoscopy Indications:           This is the patient's first colonoscopy, Unexplained                         iron deficiency anemia Providers:             Corinn Jess Brooklyn MD, MD Referring MD:          Hadassah SQUIBB. Herold (Referring MD) Medicines:             General Anesthesia Complications:         No immediate complications. Estimated blood loss: None. Procedure:             Pre-Anesthesia Assessment:                        - Prior to the procedure, a History and Physical was                         performed, and patient medications and allergies were                         reviewed. The patient is competent. The risks and                         benefits of the procedure and the sedation options and                         risks were discussed with the patient. All questions                         were answered and informed consent was obtained.                         Patient identification and proposed procedure were                         verified by the physician, the nurse, the                         anesthesiologist, the anesthetist and the technician                         in the pre-procedure area in the procedure room in the                         endoscopy suite. Mental Status Examination: alert and                         oriented. Airway Examination: normal oropharyngeal                         airway and neck mobility. Respiratory Examination:  clear to auscultation. CV Examination: normal.                         Prophylactic Antibiotics: The patient does not require                         prophylactic antibiotics.  Prior Anticoagulants: The                         patient has taken no anticoagulant or antiplatelet                         agents. ASA Grade Assessment: II - A patient with mild                         systemic disease. After reviewing the risks and                         benefits, the patient was deemed in satisfactory                         condition to undergo the procedure. The anesthesia                         plan was to use general anesthesia. Immediately prior                         to administration of medications, the patient was                         re-assessed for adequacy to receive sedatives. The                         heart rate, respiratory rate, oxygen saturations,                         blood pressure, adequacy of pulmonary ventilation, and                         response to care were monitored throughout the                         procedure. The physical status of the patient was                         re-assessed after the procedure.                        After obtaining informed consent, the colonoscope was                         passed under direct vision. Throughout the procedure,                         the patient's blood pressure, pulse, and oxygen                         saturations were monitored continuously. The  Colonoscope was introduced through the anus and                         advanced to the the terminal ileum, with                         identification of the appendiceal orifice and IC                         valve. The colonoscopy was performed without                         difficulty. The patient tolerated the procedure well.                         The quality of the bowel preparation was evaluated                         using the BBPS [facility] Bowel Preparation Scale) with                         scores of: Right Colon = 3, Transverse Colon = 3 and                         Left Colon = 3 (entire mucosa seen  well with no                         residual staining, small fragments of stool or opaque                         liquid). The total BBPS score equals 9. The terminal                         ileum, ileocecal valve, appendiceal orifice, and                         rectum were photographed. Findings:      The perianal and digital rectal examinations were normal. Pertinent       negatives include normal sphincter tone and no palpable rectal lesions.      The terminal ileum appeared normal.      Scattered small-mouthed diverticula were found in the left colon.      The exam was otherwise without abnormality on direct and retroflexion       views. Impression:            - The examined portion of the ileum was normal.                        - Diverticulosis in the left colon.                        - The examination was otherwise normal on direct and                         retroflexion views.                        - No specimens collected. Recommendation:        -  Discharge patient to home (with escort).                        - Resume previous diet today.                        - Continue present medications.                        - Repeat colonoscopy in 10 years for screening                         purposes. Procedure Code(s):     --- Professional ---                        [id number], Colonoscopy, flexible; diagnostic, including                         collection of specimen(s) by brushing or washing, when                         performed (separate procedure) Diagnosis Code(s):     --- Professional ---                        D50.9, Iron deficiency anemia, unspecified                        K57.30, Diverticulosis of large intestine without                         perforation or abscess without bleeding CPT copyright 2022 American Medical Association. All rights reserved. The codes documented in this report are preliminary and upon coder review may  be revised to meet current compliance  requirements. Dr. Corinn Brooklyn Corinn Jess Brooklyn MD, MD 11/01/2024 9:08:00 AM This report has been signed electronically. Number of Addenda: 0 Note Initiated On: 11/01/2024 8:35 AM Scope Withdrawal Time: 0 hours 4 minutes 40 seconds  Total Procedure Duration: 0 hours 6 minutes 43 seconds  Estimated Blood Loss:  Estimated blood loss: none.      [facility]

## 2024-11-01 NOTE — Anesthesia Preprocedure Evaluation (Addendum)
 Anesthesia Evaluation  Patient identified by MRN, date of birth, ID band Patient awake    Reviewed: Allergy & Precautions, NPO status , Patient's Chart, lab work & pertinent test results  History of Anesthesia Complications Negative for: history of anesthetic complications  Airway Mallampati: III  TM Distance: >3 FB Neck ROM: full    Dental  (+) Chipped   Pulmonary asthma , sleep apnea , Current SmokerPatient did not abstain from smoking.   Pulmonary exam normal        Cardiovascular hypertension, On Medications Normal cardiovascular exam     Neuro/Psych CVA  negative psych ROS   GI/Hepatic Neg liver ROS,GERD  Medicated and Controlled,,  Endo/Other  negative endocrine ROS    Renal/GU negative Renal ROS  negative genitourinary   Musculoskeletal   Abdominal   Peds  Hematology  (+) Blood dyscrasia, anemia   Anesthesia Other Findings Past Medical History: No date: Asthma     Comment:  in the past No date: GERD (gastroesophageal reflux disease) No date: Hypertension No date: Sleep apnea     Comment:  not had a sleep study yet  History reviewed. No pertinent surgical history.     Reproductive/Obstetrics negative OB ROS                              Anesthesia Physical Anesthesia Plan  ASA: 2  Anesthesia Plan: General   Post-op Pain Management: Minimal or no pain anticipated   Induction: Intravenous  PONV Risk Score and Plan: 1 and Propofol infusion and TIVA  Airway Management Planned: Natural Airway and Nasal Cannula  Additional Equipment:   Intra-op Plan:   Post-operative Plan:   Informed Consent: I have reviewed the patients History and Physical, chart, labs and discussed the procedure including the risks, benefits and alternatives for the proposed anesthesia with the patient or authorized representative who has indicated his/her understanding and acceptance.      Dental Advisory Given  Plan Discussed with: Anesthesiologist, CRNA and Surgeon  Anesthesia Plan Comments: (Patient consented for risks of anesthesia including but not limited to:  - adverse reactions to medications - risk of airway placement if required - damage to eyes, teeth, lips or other oral mucosa - nerve damage due to positioning  - sore throat or hoarseness - Damage to heart, brain, nerves, lungs, other parts of body or loss of life  Patient voiced understanding and assent.)         Anesthesia Quick Evaluation

## 2024-11-05 ENCOUNTER — Telehealth: Payer: Self-pay | Admitting: Pediatrics

## 2024-11-05 LAB — SURGICAL PATHOLOGY

## 2024-11-05 NOTE — Telephone Encounter (Signed)
 error

## 2024-11-05 NOTE — Progress Notes (Signed)
 Called patient and left a message for him to call back to get scheduled.

## 2024-11-06 ENCOUNTER — Ambulatory Visit: Payer: Self-pay | Admitting: Gastroenterology

## 2024-11-06 NOTE — Progress Notes (Signed)
 Please inform patient that the pathology results from upper endoscopy confirm H. pylori gastritis Recommend treatment for it with quadruple therapy to treat H Pylori for 14 days He is already on omeprazole  40 mg twice daily and continue the same  Bismuth subsalicylate 300 mg 4 times daily Tetracycline 500 mg 4 times daily  Metronidazole  500 mg 3 times daily    Order H Pylori breath test in 2weeks after completing medication to confirm eradication.  Patient should be off prilosec  and H2 blocker atleast for 2weeks before the test  Thanks RV

## 2024-11-07 NOTE — Progress Notes (Signed)
 Called patient and left a message for him to call back to get scheduled. Please call to schedule missed follow up, ok to do follow up in 3-4 weeks. Thanks!
